# Patient Record
Sex: Female | Born: 1970
Health system: Southern US, Community
[De-identification: ages and names within clinical notes are randomized; demographics above are authoritative.]

## PROBLEM LIST (undated history)

## (undated) DIAGNOSIS — Z8042 Family history of malignant neoplasm of prostate: Secondary | ICD-10-CM

## (undated) DIAGNOSIS — C50919 Malignant neoplasm of unspecified site of unspecified female breast: Secondary | ICD-10-CM

## (undated) DIAGNOSIS — T7840XA Allergy, unspecified, initial encounter: Secondary | ICD-10-CM

## (undated) DIAGNOSIS — Z975 Presence of (intrauterine) contraceptive device: Secondary | ICD-10-CM

## (undated) DIAGNOSIS — Z800 Family history of malignant neoplasm of digestive organs: Secondary | ICD-10-CM

## (undated) DIAGNOSIS — G43909 Migraine, unspecified, not intractable, without status migrainosus: Secondary | ICD-10-CM

## (undated) HISTORY — PX: COLONOSCOPY: SHX174

## (undated) HISTORY — DX: Family history of malignant neoplasm of prostate: Z80.42

## (undated) HISTORY — PX: WISDOM TOOTH EXTRACTION: SHX21

## (undated) HISTORY — PX: COSMETIC SURGERY: SHX468

## (undated) HISTORY — DX: Migraine, unspecified, not intractable, without status migrainosus: G43.909

## (undated) HISTORY — DX: Allergy, unspecified, initial encounter: T78.40XA

## (undated) HISTORY — DX: Family history of malignant neoplasm of digestive organs: Z80.0

## (undated) HISTORY — DX: Presence of (intrauterine) contraceptive device: Z97.5

## (undated) HISTORY — PX: DENTAL SURGERY: SHX609

## (undated) HISTORY — DX: Malignant neoplasm of unspecified site of unspecified female breast: C50.919

---

## 2002-08-03 HISTORY — PX: BREAST BIOPSY: SHX20

## 2016-06-03 ENCOUNTER — Other Ambulatory Visit: Payer: Self-pay | Admitting: Family Medicine

## 2016-06-03 DIAGNOSIS — Z1231 Encounter for screening mammogram for malignant neoplasm of breast: Secondary | ICD-10-CM

## 2016-06-04 DIAGNOSIS — Z0000 Encounter for general adult medical examination without abnormal findings: Secondary | ICD-10-CM | POA: Diagnosis not present

## 2016-06-04 LAB — LIPID PANEL
Cholesterol: 131 (ref 0–200)
HDL: 50 (ref 35–70)
LDL CALC: 75
TRIGLYCERIDES: 32 — AB (ref 40–160)

## 2016-06-04 LAB — BASIC METABOLIC PANEL: Glucose: 87

## 2016-07-10 ENCOUNTER — Ambulatory Visit
Admission: RE | Admit: 2016-07-10 | Discharge: 2016-07-10 | Disposition: A | Discharge status: Home / Self Care | Payer: BLUE CROSS/BLUE SHIELD | Source: Ambulatory Visit | Attending: Family Medicine | Admitting: Family Medicine

## 2016-07-10 DIAGNOSIS — Z1231 Encounter for screening mammogram for malignant neoplasm of breast: Secondary | ICD-10-CM

## 2016-08-12 DIAGNOSIS — D229 Melanocytic nevi, unspecified: Secondary | ICD-10-CM | POA: Diagnosis not present

## 2016-08-12 DIAGNOSIS — L814 Other melanin hyperpigmentation: Secondary | ICD-10-CM | POA: Diagnosis not present

## 2018-04-14 DIAGNOSIS — H3582 Retinal ischemia: Secondary | ICD-10-CM | POA: Diagnosis not present

## 2018-05-17 ENCOUNTER — Ambulatory Visit: Payer: BLUE CROSS/BLUE SHIELD | Admitting: Family Medicine

## 2018-05-17 ENCOUNTER — Encounter: Payer: Self-pay | Admitting: Family Medicine

## 2018-05-17 ENCOUNTER — Other Ambulatory Visit: Payer: Self-pay

## 2018-05-17 VITALS — BP 98/62 | HR 68 | Temp 97.9°F | Ht 66.0 in | Wt 148.6 lb

## 2018-05-17 DIAGNOSIS — Z0000 Encounter for general adult medical examination without abnormal findings: Secondary | ICD-10-CM | POA: Diagnosis not present

## 2018-05-17 DIAGNOSIS — Z975 Presence of (intrauterine) contraceptive device: Secondary | ICD-10-CM | POA: Diagnosis not present

## 2018-05-17 DIAGNOSIS — M2560 Stiffness of unspecified joint, not elsewhere classified: Secondary | ICD-10-CM | POA: Diagnosis not present

## 2018-05-17 DIAGNOSIS — R232 Flushing: Secondary | ICD-10-CM

## 2018-05-17 DIAGNOSIS — H3581 Retinal edema: Secondary | ICD-10-CM

## 2018-05-17 DIAGNOSIS — F419 Anxiety disorder, unspecified: Secondary | ICD-10-CM

## 2018-05-17 LAB — CBC WITH DIFFERENTIAL/PLATELET
BASOS PCT: 0.4 % (ref 0.0–3.0)
Basophils Absolute: 0 10*3/uL (ref 0.0–0.1)
EOS ABS: 0 10*3/uL (ref 0.0–0.7)
Eosinophils Relative: 0.5 % (ref 0.0–5.0)
HEMATOCRIT: 38.4 % (ref 36.0–46.0)
Hemoglobin: 12.9 g/dL (ref 12.0–15.0)
LYMPHS PCT: 20.5 % (ref 12.0–46.0)
Lymphs Abs: 1.5 10*3/uL (ref 0.7–4.0)
MCHC: 33.6 g/dL (ref 30.0–36.0)
MCV: 94.2 fl (ref 78.0–100.0)
Monocytes Absolute: 0.4 10*3/uL (ref 0.1–1.0)
Monocytes Relative: 6 % (ref 3.0–12.0)
NEUTROS ABS: 5.2 10*3/uL (ref 1.4–7.7)
Neutrophils Relative %: 72.6 % (ref 43.0–77.0)
PLATELETS: 254 10*3/uL (ref 150.0–400.0)
RBC: 4.07 Mil/uL (ref 3.87–5.11)
RDW: 13 % (ref 11.5–15.5)
WBC: 7.1 10*3/uL (ref 4.0–10.5)

## 2018-05-17 LAB — LIPID PANEL
CHOL/HDL RATIO: 3
Cholesterol: 139 mg/dL (ref 0–200)
HDL: 52.3 mg/dL (ref >39.00)
LDL CALC: 77 mg/dL (ref 0–99)
NonHDL: 86.67
TRIGLYCERIDES: 50 mg/dL (ref 0.0–149.0)
VLDL: 10 mg/dL (ref 0.0–40.0)

## 2018-05-17 LAB — COMPREHENSIVE METABOLIC PANEL
ALT: 11 U/L (ref 0–35)
AST: 12 U/L (ref 0–37)
Albumin: 4.4 g/dL (ref 3.5–5.2)
Alkaline Phosphatase: 58 U/L (ref 39–117)
BUN: 14 mg/dL (ref 6–23)
CHLORIDE: 103 meq/L (ref 96–112)
CO2: 30 meq/L (ref 19–32)
Calcium: 9.5 mg/dL (ref 8.4–10.5)
Creatinine, Ser: 0.62 mg/dL (ref 0.40–1.20)
GFR: 108.82 mL/min (ref >60.00)
Glucose, Bld: 127 mg/dL — ABNORMAL HIGH (ref 70–99)
Potassium: 3.6 mEq/L (ref 3.5–5.1)
Sodium: 140 mEq/L (ref 135–145)
Total Bilirubin: 1 mg/dL (ref 0.2–1.2)
Total Protein: 7.1 g/dL (ref 6.0–8.3)

## 2018-05-17 LAB — SEDIMENTATION RATE: Sed Rate: 13 mm/hr (ref 0–20)

## 2018-05-17 LAB — FOLLICLE STIMULATING HORMONE: FSH: 15.8 m[IU]/mL

## 2018-05-17 LAB — TSH: TSH: 0.87 u[IU]/mL (ref 0.35–4.50)

## 2018-05-17 NOTE — Progress Notes (Signed)
Subjective  CC:  Chief Complaint  Patient presents with  . Establish Care    TC from [place] at [facility], moved here from [place] in 2016  . Eye Problem    Patient states she saw a eye doctor and she has an image on her eye, she feels is medical     HPI: Kelli [name] is a 47 y.o. female who presents to [place] at [facility] today to establish care with me as a new patient.  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  She recently saw her eye doctor and he identified a cotton-wool spot.  She has no history of hypertension, diabetes, hyperlipidemia or autoimmune condition.  She feels well. She has the following concerns or needs:  As above, here to ensure no organic etiology of her coexisting medical condition to explain the cotton-wool spot.  She denies headaches, pain with chewing, visual disturbance, palpitations; her lifestyle is healthy.  She exercises routinely.  No recent headaches.  She eats well.  Review of systems is positive for morning stiffness in her hands and feet with some joint pain.  But she denies red hot swollen joints.  She does have a family history of rheumatoid arthritis.  Health maintenance is due for Pap smear and physical.  She does have an IUD in place that was placed in 2015.  She reports it is a hormonal IUD.  Review of systems is positive for mild anxiety and irritability symptoms occasional hot flushes.  She denies history of anxiety or mood disorder.  Assessment  1. Annual physical exam   2. Cotton wool spots   3. IUD (intrauterine device) in place   4. Anxiety   5. Morning stiffness of joints      Plan  Female Wellness Visit:  Age appropriate Health Maintenance and Prevention measures were discussed with patient. Included topics are cancer screening recommendations, ways to keep healthy (see AVS) including dietary and exercise recommendations, regular eye and dental  care, use of seat belts, and avoidance of moderate alcohol use and tobacco use.  She will return for Pap smear and female wellness exam  BMI: discussed patient's BMI and encouraged positive lifestyle modifications to help get to or maintain a target BMI.  HM needs and immunizations were addressed and ordered. See below for orders. See HM and immunization section for updates.  Routine labs and screening tests ordered including cmp, cbc and lipids where appropriate.  Discussed recommendations regarding Vit D and calcium supplementation (see AVS)  Start work-up for cotton-wool spot.  Check lab work.  Monitor joint pain symptoms.  Check rheumatoid factor.  Education on anxiety symptoms, could be related to perimenopausal status.  Check FSH.  Will return if she feels that she needs more help with her hot flashes and anxiety symptoms.    Follow up:  Pap smear  Orders Placed This Encounter  Procedures  . CBC with Differential/Platelet  . Comprehensive metabolic panel  . Lipid panel  . HIV Antibody (routine testing w rflx)  . TSH  . Sedimentation rate  . Rheumatoid Factor   No orders of the defined types were placed in this encounter.    No flowsheet data found.  We updated and reviewed the patient's past history in detail and it is documented below.  Patient Active Problem List   Diagnosis Date Noted  . Cotton wool spots 05/17/2018  . IUD (intrauterine device) in place 05/17/2018  June 2015    Health Maintenance  Topic Date Due  . HIV Screening  07/23/1984  . TETANUS/TDAP  07/23/1988  . PAP SMEAR  01/01/2018  . INFLUENZA VACCINE  03/03/2018    There is no immunization history on file for this patient. Current Meds  Medication Sig  . Multiple Vitamins-Minerals (EYE VITAMINS PO) Take by mouth.    Allergies: Patient is allergic to sulfa antibiotics. Past Medical History Patient  has a past medical history of IUD (intrauterine device) in place (05/17/2018) and  Migraines. Past Surgical History Patient  has a past surgical history that includes Breast biopsy (2004). Family History: Patient family history is not on file. Social History:  Patient  reports that she has never smoked. She has never used smokeless tobacco. She reports that she drinks alcohol. She reports that she does not use drugs.  Review of Systems: Constitutional: negative for fever or malaise Ophthalmic: negative for photophobia, double vision or loss of vision Cardiovascular: negative for chest pain, dyspnea on exertion, or new LE swelling Respiratory: negative for SOB or persistent cough Gastrointestinal: negative for abdominal pain, change in bowel habits or melena Genitourinary: negative for dysuria or gross hematuria Musculoskeletal: negative for new gait disturbance or muscular weakness Integumentary: negative for new or persistent rashes Neurological: negative for TIA or stroke symptoms Psychiatric: negative for SI or delusions Allergic/Immunologic: negative for hives  Patient Care Team    Relationship Specialty Notifications Start End  Leamon Arnt, MD PCP - General Family Medicine  05/17/18     Objective  Vitals: BP 98/62   Pulse 68   Temp 97.9 F (36.6 C)   Ht 5\' 6"  (1.676 m)   Wt 148 lb 9.6 oz (67.4 kg)   SpO2 99%   BMI 23.98 kg/m  General:  Well developed, well nourished, no acute distress  Psych:  Alert and oriented,normal mood and affect HEENT:  Normocephalic, atraumatic, non-icteric sclera, PERRL, oropharynx is without mass or exudate, supple neck without adenopathy, mass or thyromegaly Cardiovascular:  RRR without gallop, rub or murmur, nondisplaced PMI Respiratory:  Good breath sounds bilaterally, CTAB with normal respiratory effort Gastrointestinal: normal bowel sounds, soft, non-tender, no noted masses. No HSM MSK: no deformities, contusions. Joints are without erythema or swelling Skin:  Warm, no rashes or suspicious lesions noted Neurologic:     Mental status is normal. Gross motor and sensory exams are normal. Normal gait   Commons side effects, risks, benefits, and alternatives for medications and treatment plan prescribed today were discussed, and the patient expressed understanding of the given instructions. Patient is instructed to call or message via MyChart if he/she has any questions or concerns regarding our treatment plan. No barriers to understanding were identified. We discussed Red Flag symptoms and signs in detail. Patient expressed understanding regarding what to do in case of urgent or emergency type symptoms.   Medication list was reconciled, printed and provided to the patient in AVS. Patient instructions and summary information was reviewed with the patient as documented in the AVS. This note was prepared with assistance of Dragon voice recognition software. Occasional wrong-word or sound-a-like substitutions may have occurred due to the inherent limitations of voice recognition software

## 2018-05-17 NOTE — Patient Instructions (Addendum)
Please return for your pap smear at your convenience.  I will release your lab results to you on your MyChart account with further instructions. Please reply with any questions.   It was a pleasure meeting you today! Thank you for choosing [place] to meet your healthcare needs! I truly look forward to working with you. If you have any questions or concerns, please send me a message via Mychart or call the office at [phone].  Please check to see when your last Tdap was and let me know  Perimenopause Perimenopause is the time when your body begins to move into the menopause (no menstrual period for 12 straight months). It is a natural process. Perimenopause can begin 2-8 years before the menopause and usually lasts for 1 year after the menopause. During this time, your ovaries may or may not produce an egg. The ovaries vary in their production of estrogen and progesterone hormones each month. This can cause irregular menstrual periods, difficulty getting pregnant, vaginal bleeding between periods, and uncomfortable symptoms. What are the causes?  Irregular production of the ovarian hormones, estrogen and progesterone, and not ovulating every month. Other causes include:  Tumor of the pituitary gland in the brain.  Medical disease that affects the ovaries.  Radiation treatment.  Chemotherapy.  Unknown causes.  Heavy smoking and excessive alcohol intake can bring on perimenopause sooner.  What are the signs or symptoms?  Hot flashes.  Night sweats.  Irregular menstrual periods.  Decreased sex drive.  Vaginal dryness.  Headaches.  Mood swings.  Depression.  Memory problems.  Irritability.  Tiredness.  Weight gain.  Trouble getting pregnant.  The beginning of losing bone cells (osteoporosis).  The beginning of hardening of the arteries (atherosclerosis). How is this diagnosed? Your health care provider will make a diagnosis by analyzing your age, menstrual  history, and symptoms. He or she will do a physical exam and note any changes in your body, especially your female organs. Female hormone tests may or may not be helpful depending on the amount of female hormones you produce and when you produce them. However, other hormone tests may be helpful to rule out other problems. How is this treated? In some cases, no treatment is needed. The decision on whether treatment is necessary during the perimenopause should be made by you and your health care provider based on how the symptoms are affecting you and your lifestyle. Various treatments are available, such as:  Treating individual symptoms with a specific medicine for that symptom.  Herbal medicines that can help specific symptoms.  Counseling.  Group therapy.  Follow these instructions at home:  Keep track of your menstrual periods (when they occur, how heavy they are, how long between periods, and how long they last) as well as your symptoms and when they started.  Only take over-the-counter or prescription medicines as directed by your health care provider.  Sleep and rest.  Exercise.  Eat a diet that contains calcium (good for your bones) and soy (acts like the estrogen hormone).  Do not smoke.  Avoid alcoholic beverages.  Take vitamin supplements as recommended by your health care provider. Taking vitamin E may help in certain cases.  Take calcium and vitamin D supplements to help prevent bone loss.  Group therapy is sometimes helpful.  Acupuncture may help in some cases. Contact a health care provider if:  You have questions about any symptoms you are having.  You need a referral to a specialist (gynecologist, psychiatrist, or psychologist). Get  help right away if:  You have vaginal bleeding.  Your period lasts longer than 8 days.  Your periods are recurring sooner than 21 days.  You have bleeding after intercourse.  You have severe depression.  You have pain  when you urinate.  You have severe headaches.  You have vision problems. This information is not intended to replace advice given to you by your health care provider. Make sure you discuss any questions you have with your health care provider. Document Released: 08/27/2004 Document Revised: 12/26/2015 Document Reviewed: 02/16/2013 Elsevier Interactive Patient Education  2017 [employer].

## 2018-05-18 ENCOUNTER — Other Ambulatory Visit (INDEPENDENT_AMBULATORY_CARE_PROVIDER_SITE_OTHER): Payer: BLUE CROSS/BLUE SHIELD

## 2018-05-18 DIAGNOSIS — R7309 Other abnormal glucose: Secondary | ICD-10-CM | POA: Diagnosis not present

## 2018-05-18 LAB — HIV ANTIBODY (ROUTINE TESTING W REFLEX): HIV 1&2 Ab, 4th Generation: NONREACTIVE

## 2018-05-18 LAB — RHEUMATOID FACTOR: Rhuematoid fact SerPl-aCnc: <14 IU/mL (ref <14)

## 2018-05-18 LAB — HEMOGLOBIN A1C: Hgb A1c MFr Bld: 5.4 % (ref 4.6–6.5)

## 2018-05-18 NOTE — Progress Notes (Signed)
Please add on a1c, dx: hyperglycemia and cotton wool spot Thanks, Dr. Jonni Sanger '

## 2018-05-19 NOTE — Progress Notes (Signed)
Please call patient: I have reviewed his/her lab results. All tests are normal. No findings worrisome for chronic disease to explain eye findings; rheumatoid factor is also negative.

## 2018-05-23 ENCOUNTER — Ambulatory Visit: Payer: BLUE CROSS/BLUE SHIELD | Admitting: Family Medicine

## 2018-05-25 ENCOUNTER — Other Ambulatory Visit: Payer: Self-pay

## 2018-05-25 ENCOUNTER — Ambulatory Visit (INDEPENDENT_AMBULATORY_CARE_PROVIDER_SITE_OTHER): Payer: BLUE CROSS/BLUE SHIELD | Admitting: Family Medicine

## 2018-05-25 ENCOUNTER — Encounter: Payer: Self-pay | Admitting: Emergency Medicine

## 2018-05-25 ENCOUNTER — Other Ambulatory Visit (HOSPITAL_COMMUNITY)
Admission: RE | Admit: 2018-05-25 | Discharge: 2018-05-25 | Disposition: A | Discharge status: Home / Self Care | Payer: BLUE CROSS/BLUE SHIELD | Source: Ambulatory Visit | Attending: Family Medicine | Admitting: Family Medicine

## 2018-05-25 ENCOUNTER — Encounter: Payer: Self-pay | Admitting: Family Medicine

## 2018-05-25 VITALS — BP 122/74 | HR 68 | Temp 98.2°F | Ht 66.0 in | Wt 146.8 lb

## 2018-05-25 DIAGNOSIS — Z1239 Encounter for other screening for malignant neoplasm of breast: Secondary | ICD-10-CM

## 2018-05-25 DIAGNOSIS — Z124 Encounter for screening for malignant neoplasm of cervix: Secondary | ICD-10-CM

## 2018-05-25 DIAGNOSIS — Z01419 Encounter for gynecological examination (general) (routine) without abnormal findings: Secondary | ICD-10-CM | POA: Diagnosis not present

## 2018-05-25 NOTE — Patient Instructions (Signed)
Please return in June for IUD removal and replacement.  Please call ahead or send me a message so that I may order the IUD prior to your appointment.   If you have any questions or concerns, please don't hesitate to send me a message via MyChart or call the office at [phone]. Thank you for visiting with [place] today! It's our pleasure caring for you.  I will let you know about your pap smear results in about a week when they return.   We will call you with an appointment for your mammogram.

## 2018-05-25 NOTE — Progress Notes (Signed)
Subjective  Chief Complaint  Patient presents with  . Gynecologic Exam    female wellness with pap, doing well, wants to do TDAP today     HPI: Kelli [name] is a 47 y.o. female who presents to [place] at [facility] today for a Female Wellness Visit.   Wellness Visit: with pap, gyn exam  Doing fine. Recent cpe with normal labs reviewed. Has f/u with eye doctor in December.   No h/o abnl paps. IUD in place due out in June. Would like to replace to get through menopause. No sxs of infection or risks of stds.   Due mammogram. Last was 2 years ago.   Assessment  1. Well female exam with routine gynecological exam   2. Cervical cancer screening   3. Breast cancer screening      Plan  Female Wellness Visit:  Age appropriate Health Maintenance and Prevention measures were discussed with patient. Included topics are cancer screening recommendations, ways to keep healthy (see AVS) including dietary and exercise recommendations, regular eye and dental care, use of seat belts, and avoidance of moderate alcohol use and tobacco use.   BMI: discussed patient's BMI and encouraged positive lifestyle modifications to help get to or maintain a target BMI. mammo ordered.   HM needs and immunizations were addressed and ordered. See below for orders. See HM and immunization section for updates. tdap today  Discussed recommendations regarding Vit D and calcium supplementation (see AVS)  IUD: will replace in June.   Follow up: Return in about 8 months (around 01/24/2019) for IUD removal and replacement.   Orders Placed This Encounter  Procedures  . MM DIGITAL SCREENING BILATERAL   No orders of the defined types were placed in this encounter.     Lifestyle: Body mass index is 23.69 kg/m. Wt Readings from Last 3 Encounters:  05/25/18 146 lb 12.8 oz (66.6 kg)  05/17/18 148 lb 9.6 oz (67.4 kg)     Patient Active Problem List   Diagnosis Date Noted  . Cotton  wool spots 05/17/2018  . IUD (intrauterine device) in place 05/17/2018    June 2015    Health Maintenance  Topic Date Due  . Samul Dada  07/23/1988  . PAP SMEAR  01/01/2018  . INFLUENZA VACCINE  Completed  . HIV Screening  Completed   Immunization History  Administered Date(s) Administered  . Influenza-Unspecified 05/24/2018   We updated and reviewed the patient's past history in detail and it is documented below. Allergies: Patient is allergic to sulfa antibiotics. Past Medical History Patient  has a past medical history of IUD (intrauterine device) in place (05/17/2018) and Migraines. Past Surgical History Patient  has a past surgical history that includes Breast biopsy (2004). Family History: Patient family history includes Arthritis in her maternal grandmother and mother; Cancer in her mother; Diabetes in her paternal grandmother; Hearing loss in her father; Hypertension in her maternal grandfather and mother; Learning disabilities in her son. Social History:  Patient  reports that she has never smoked. She has never used smokeless tobacco. She reports that she drinks alcohol. She reports that she does not use drugs.  Review of Systems: Constitutional: negative for fever or malaise Ophthalmic: negative for photophobia, double vision or loss of vision Cardiovascular: negative for chest pain, dyspnea on exertion, or new LE swelling Respiratory: negative for SOB or persistent cough Gastrointestinal: negative for abdominal pain, change in bowel habits or melena Genitourinary: negative for dysuria or gross hematuria, no abnormal uterine  bleeding or disharge Musculoskeletal: negative for new gait disturbance or muscular weakness Integumentary: negative for new or persistent rashes, no breast lumps Neurological: negative for TIA or stroke symptoms Psychiatric: negative for SI or delusions Allergic/Immunologic: negative for hives Patient Care Team    Relationship Specialty  Notifications Start End  Leamon Arnt, MD PCP - General Family Medicine  05/17/18     Objective  Vitals: BP 122/74   Pulse 68   Temp 98.2 F (36.8 C)   Ht 5\' 6"  (1.676 m)   Wt 146 lb 12.8 oz (66.6 kg)   SpO2 99%   BMI 23.69 kg/m  General:  Well developed, well nourished, no acute distress  Psych:  Alert and orientedx3,normal mood and affect Cardiovascular:  Normal S1, S2, RRR without gallop, rub or murmur, nondisplaced PMI Respiratory:  Good breath sounds bilaterally, CTAB with normal respiratory effort Gastrointestinal: normal bowel sounds, soft, non-tender, no noted masses. No HSM Skin:  Warm, no rashes or suspicious lesions noted Breast Exam: No mass, skin retraction or nipple discharge is appreciated in either breast. No axillary adenopathy. Fibrocystic changes are not noted Pelvic Exam: Normal external genitalia, no vulvar or vaginal lesions present. Clear cervix w/o CMT, IUD string visualized in os. Bimanual exam reveals a nontender fundus w/o masses, nl size. No adnexal masses present. No inguinal adenopathy. A PAP smear was performed.    Commons side effects, risks, benefits, and alternatives for medications and treatment plan prescribed today were discussed, and the patient expressed understanding of the given instructions. Patient is instructed to call or message via MyChart if he/she has any questions or concerns regarding our treatment plan. No barriers to understanding were identified. We discussed Red Flag symptoms and signs in detail. Patient expressed understanding regarding what to do in case of urgent or emergency type symptoms.   Medication list was reconciled, printed and provided to the patient in AVS. Patient instructions and summary information was reviewed with the patient as documented in the AVS. This note was prepared with assistance of Dragon voice recognition software. Occasional wrong-word or sound-a-like substitutions may have occurred due to the inherent  limitations of voice recognition software

## 2018-05-27 ENCOUNTER — Other Ambulatory Visit: Payer: Self-pay | Admitting: Family Medicine

## 2018-05-27 DIAGNOSIS — Z1231 Encounter for screening mammogram for malignant neoplasm of breast: Secondary | ICD-10-CM

## 2018-05-30 LAB — CYTOLOGY - PAP
Diagnosis: NEGATIVE
HPV: NOT DETECTED

## 2018-07-13 ENCOUNTER — Ambulatory Visit
Admission: RE | Admit: 2018-07-13 | Discharge: 2018-07-13 | Disposition: A | Discharge status: Home / Self Care | Payer: BLUE CROSS/BLUE SHIELD | Source: Ambulatory Visit | Attending: Family Medicine | Admitting: Family Medicine

## 2018-07-13 DIAGNOSIS — Z1231 Encounter for screening mammogram for malignant neoplasm of breast: Secondary | ICD-10-CM | POA: Diagnosis not present

## 2018-07-14 ENCOUNTER — Other Ambulatory Visit: Payer: Self-pay | Admitting: Family Medicine

## 2018-07-14 DIAGNOSIS — H3582 Retinal ischemia: Secondary | ICD-10-CM | POA: Diagnosis not present

## 2018-07-14 DIAGNOSIS — R928 Other abnormal and inconclusive findings on diagnostic imaging of breast: Secondary | ICD-10-CM

## 2018-07-19 ENCOUNTER — Ambulatory Visit: Admission: RE | Admit: 2018-07-19 | Payer: BLUE CROSS/BLUE SHIELD | Source: Ambulatory Visit

## 2018-07-19 ENCOUNTER — Ambulatory Visit
Admission: RE | Admit: 2018-07-19 | Discharge: 2018-07-19 | Disposition: A | Discharge status: Home / Self Care | Payer: BLUE CROSS/BLUE SHIELD | Source: Ambulatory Visit | Attending: Family Medicine | Admitting: Family Medicine

## 2018-07-19 ENCOUNTER — Other Ambulatory Visit: Payer: Self-pay | Admitting: Family Medicine

## 2018-07-19 DIAGNOSIS — R928 Other abnormal and inconclusive findings on diagnostic imaging of breast: Secondary | ICD-10-CM

## 2018-07-19 DIAGNOSIS — N6311 Unspecified lump in the right breast, upper outer quadrant: Secondary | ICD-10-CM | POA: Diagnosis not present

## 2018-07-19 DIAGNOSIS — R922 Inconclusive mammogram: Secondary | ICD-10-CM | POA: Diagnosis not present

## 2018-07-22 ENCOUNTER — Other Ambulatory Visit: Payer: Self-pay | Admitting: Family Medicine

## 2018-07-22 ENCOUNTER — Ambulatory Visit
Admission: RE | Admit: 2018-07-22 | Discharge: 2018-07-22 | Disposition: A | Discharge status: Home / Self Care | Payer: BLUE CROSS/BLUE SHIELD | Source: Ambulatory Visit | Attending: Family Medicine | Admitting: Family Medicine

## 2018-07-22 DIAGNOSIS — R928 Other abnormal and inconclusive findings on diagnostic imaging of breast: Secondary | ICD-10-CM

## 2018-07-22 DIAGNOSIS — C50411 Malignant neoplasm of upper-outer quadrant of right female breast: Secondary | ICD-10-CM | POA: Diagnosis not present

## 2018-07-22 DIAGNOSIS — N6311 Unspecified lump in the right breast, upper outer quadrant: Secondary | ICD-10-CM | POA: Diagnosis not present

## 2018-07-22 DIAGNOSIS — Z0389 Encounter for observation for other suspected diseases and conditions ruled out: Secondary | ICD-10-CM | POA: Diagnosis not present

## 2018-07-28 ENCOUNTER — Other Ambulatory Visit: Payer: BLUE CROSS/BLUE SHIELD

## 2018-08-08 ENCOUNTER — Ambulatory Visit: Payer: Self-pay | Admitting: Surgery

## 2018-08-08 DIAGNOSIS — C50911 Malignant neoplasm of unspecified site of right female breast: Secondary | ICD-10-CM | POA: Diagnosis not present

## 2018-08-08 NOTE — H&P (Signed)
Kelli [name] Documented: 08/08/2018 9:32 AM Location: [facility] Patient #: [id number] DOB: [birth year] Married / Language: English / Race: White Female  History of Present Illness Marcello Moores A. Boston Catarino MD; 08/08/2018 10:22 AM) Patient words: Patient sent at the request of Dr. Jule Ser for abnormal screening mammogram. The patient went for her yearly mammogram last month. She has very dense breasts but there is an abnormality in the right breast upper quadrant measuring roughly 1 cm was evaluated by ultrasound. This was felt to be suspicious. Core biopsy revealed an invasive mixed phenotype breast carcinoma ER positive PR positive HER-2/neu negative KI 10%. The patient denies any history of breast pain, breast masses or nipple discharge. She states her breasts are very dense and hard to examine. She has no family history of breast cancer, uterine cancer or ovarian cancer. She is sore from her biopsy was otherwise has no complaints.                            ADDENDUM REPORT: 07/19/2018 14:49 ADDENDUM: An addendum is made due to an omission in the exam portion of the report. The correct report should read as: CLINICAL DATA: Screening recall for possible right breast distortion and left breast asymmetry. EXAM: DIGITAL DIAGNOSTIC BILATERAL MAMMOGRAM WITH CAD AND TOMO RIGHT BREAST ULTRASOUND COMPARISON: Previous exam(s). ACR Breast Density: ACR Breast Density Category d: The breast tissue is extremely dense, which lowers the sensitivity of mammography. FINDINGS: Additional tomograms were performed of the bilateral breasts. There is a persistent area of subtle distortion in the upper-outer posterior right breast. The additional initially questioned area of possible right breast distortion appears to resolve on the additional imaging. The initially questioned asymmetry in the left breast resolves on the additional imaging with findings  compatible with overlapping structures. Mammographic images were processed with CAD. Physical examination of the upper-outer right breast reveals an area of thickening at the approximate 11 o'clock position. Targeted ultrasound of the upper-outer right breast was performed. There is an irregular hypoechoic mass at the 11 o'clock position 4 cm from nipple measuring 1.4 x 1.1 x 0.8 cm. This is felt to correspond with the distortion seen in the upper-outer right breast on mammography. Several benign cysts are seen in the right breast, with a cyst at the 12 o'clock position 4 cm from nipple measuring 0.8 x 0.7 x 0.7 cm. No lymphadenopathy seen in the right axilla. IMPRESSION: Suspicious right breast mass/distortion at the 11 o'clock position. RECOMMENDATION: 1. Recommend ultrasound-guided biopsy of the mass in the right breast at the 11 o'clock position. 2. Depending on biopsy results, recommend bilateral breast MRI given extremely dense fibroglandular tissue and the additional possible area of distortion seen in the right breast that was not definitely reproduced on today's additional imaging. I have discussed the findings and recommendations with the patient. Results were also provided in writing at the conclusion of the visit. If applicable, a reminder letter will be sent to the patient regarding the next appointment. BI-RADS CATEGORY: 4: Suspicious. Electronically Signed By: Everlean Alstrom M.D. On: 07/19/2018 14:49 Addended by Everlean Alstrom, MD on 07/19/2018 2:51 PM  Study Result CLINICAL DATA: Screening recall for possible right breast distortion and left breast asymmetry. EXAM: DIGITAL DIAGNOSTIC BILATERAL MAMMOGRAM WITH CAD AND TOMO COMPARISON: Previous exam(s). ACR Breast Density Category d: The breast tissue is extremely dense, which lowers the sensitivity of mammography. FINDINGS: Additional tomograms were performed of the bilateral breasts. There is a  persistent area of subtle distortion in the upper-outer posterior right breast. The additional initially questioned area of possible right breast distortion appears to resolve on the additional imaging. The initially questioned asymmetry in the left breast resolves on the additional imaging with findings compatible with overlapping structures. Mammographic images were processed with CAD. Physical examination of the upper-outer right breast reveals an area of thickening at the approximate 11 o'clock position. Targeted ultrasound of the upper-outer right breast was performed. There is an irregular hypoechoic mass at the 11 o'clock position 4 cm from nipple measuring 1.4 x 1.1 x 0.8 cm. This is felt to correspond with the distortion seen in the upper-outer right breast on mammography. Several benign cysts are seen in the right breast, with a cyst at the 12 o'clock position 4 cm from nipple measuring 0.8 x 0.7 x 0.7 cm. No lymphadenopathy seen in the right axilla. IMPRESSION: Suspicious right breast mass/distortion at the 11 o'clock position. RECOMMENDATION: 1. Recommend ultrasound-guided biopsy of the mass in the right breast at the 11 o'clock position. 2. Depending on biopsy results, recommend bilateral breast MRI given extremely dense fibroglandular tissue and the additional possible area of distortion seen in the right breast that was not definitely reproduced on today's additional imaging. I have discussed the findings and recommendations with the patient. Results were also provided in writing at the conclusion of the visit. If applicable, a reminder letter will be sent to the patient regarding the next appointment. BI-RADS CATEGORY 4: Suspicious. Electronically Signed: By: Everlean Alstrom M.D. On: 07/19/2018 12:22                 ADDITIONAL INFORMATION: E-cadherin is moderately positive in some areas and only weakly positive in others, suggestive of a  mixed phenotype. Vicente Males MD Pathologist, Electronic Signature ( Signed 07/26/2018) PROGNOSTIC INDICATORS Results: IMMUNOHISTOCHEMICAL AND MORPHOMETRIC ANALYSIS PERFORMED MANUALLY The tumor cells are NEGATIVE for Her2 (1+). Estrogen Receptor: 95%, POSITIVE, STRONG STAINING INTENSITY Progesterone Receptor: 95%, POSITIVE, STRONG STAINING INTENSITY Proliferation Marker Ki67: 10% REFERENCE RANGE ESTROGEN RECEPTOR NEGATIVE 0% POSITIVE =>1% REFERENCE RANGE PROGESTERONE RECEPTOR NEGATIVE 0% POSITIVE =>1% All controls stained appropriately Enid Cutter MD Pathologist, Electronic Signature ( Signed 07/28/2018) 1 of 3 FINAL for Yung, Mayla ANN ([id number]) FINAL DIAGNOSIS Diagnosis Breast, right, needle core biopsy, 11 o'clock, 4cmfn - INVASIVE MAMMARY CARCINOMA, SEE COMMENT. - MAMMARY CARCINOMA IN SITU. Microscopic Comment The carcinoma appears grade 2. E-cadherin will be ordered. Prognostic markers will be ordered. Dr. Lyndon Code has reviewed the case. The case was called to [facility] on 07/25/2018. Vicente Males MD Pathologist, Electronic Signature (Case signed 07/25/2018) Specimen Gross and Clinical Information Specimen Comment Time in formalin: 9:56 AM, extracted 1 minute; 1.4cm mass Specimen(s) Obtained: Breast, right, needle core biopsy, 11 o'clock, 4cmfn Specimen Clinical Information Should be [facility] Gross Received labeled as "Linder,Bryant" and "right breast 11:00 4cmfn.  The patient is a 48 year old female.   Past Surgical History Nance Pew, CMA; 08/08/2018 9:32 AM) Breast Biopsy Right.  Diagnostic Studies History Nance Pew, CMA; 08/08/2018 9:32 AM) Colonoscopy never Mammogram within last year Pap Smear 1-5 years ago  Allergies Nance Pew, CMA; 08/08/2018 9:33 AM) Sulfa Drugs Rash. Allergies Reconciled  Medication History Nance Pew, CMA; 08/08/2018 9:33 AM) No Current Medications Medications Reconciled  Social  History Nance Pew, CMA; 08/08/2018 9:32 AM) Alcohol use Moderate alcohol use. Caffeine use Coffee. No drug use Tobacco use Never smoker.  Family History Nance Pew, [place]; 08/08/2018 9:32 AM) Colon Cancer Mother.  Colon Polyps Mother. Depression Mother. Hypertension Mother. Migraine Headache Mother.  Pregnancy / Birth History Nance Pew, [place]; 08/08/2018 9:32 AM) Age at menarche 17 years. Contraceptive History Intrauterine device. Gravida 2 Irregular periods Length (months) of breastfeeding 7-12 Maternal age 35-35 Para 2  Other Problems Nance Pew, CMA; 08/08/2018 9:32 AM) Breast Cancer     Review of Systems (Asucena Galer A. Angelamarie Avakian MD; 08/08/2018 10:22 AM) General Not Present- Appetite Loss, Chills, Fatigue, Fever, Night Sweats, Weight Gain and Weight Loss. Skin Not Present- Change in Wart/Mole, Dryness, Hives, Jaundice, New Lesions, Non-Healing Wounds, Rash and Ulcer. HEENT Present- Wears glasses/contact lenses. Not Present- Earache, Hearing Loss, Hoarseness, Nose Bleed, Oral Ulcers, Ringing in the Ears, Seasonal Allergies, Sinus Pain, Sore Throat, Visual Disturbances and Yellow Eyes. Respiratory Not Present- Bloody sputum, Chronic Cough, Difficulty Breathing, Snoring and Wheezing. Breast Present- Breast Mass. Not Present- Breast Pain, Nipple Discharge and Skin Changes. Cardiovascular Not Present- Chest Pain, Difficulty Breathing Lying Down, Leg Cramps, Palpitations, Rapid Heart Rate, Shortness of Breath and Swelling of Extremities. Gastrointestinal Not Present- Abdominal Pain, Bloating, Bloody Stool, Change in Bowel Habits, Chronic diarrhea, Constipation, Difficulty Swallowing, Excessive gas, Gets full quickly at meals, Hemorrhoids, Indigestion, Nausea, Rectal Pain and Vomiting. Female Genitourinary Not Present- Frequency, Nocturia, Painful Urination, Pelvic Pain and Urgency. Musculoskeletal Not Present- Back Pain, Joint Pain, Joint Stiffness, Muscle Pain, Muscle  Weakness and Swelling of Extremities. Neurological Present- Headaches. Not Present- Decreased Memory, Fainting, Numbness, Seizures, Tingling, Tremor, Trouble walking and Weakness. Psychiatric Present- Anxiety. Not Present- Bipolar, Change in Sleep Pattern, Depression, Fearful and Frequent crying. Endocrine Not Present- Cold Intolerance, Excessive Hunger, Hair Changes, Heat Intolerance, Hot flashes and New Diabetes. Hematology Not Present- Blood Thinners, Easy Bruising, Excessive bleeding, Gland problems, HIV and Persistent Infections. All other systems negative  Vitals (Sabrina Canty CMA; 08/08/2018 9:34 AM) 08/08/2018 9:34 AM Weight: 143.5 lb Height: 66in Body Surface Area: 1.74 m Body Mass Index: 23.16 kg/m  Temp.: 97.34F(Temporal)  Pulse: 118 (Regular)  BP: 118/78 (Sitting, Right Arm, Standard)      Physical Exam (Ignatz Deis A. Adyan Palau MD; 08/08/2018 10:22 AM)  General Mental Status-Alert. General Appearance-Consistent with stated age. Hydration-Well hydrated. Voice-Normal.  Head and Neck Head-normocephalic, atraumatic with no lesions or palpable masses. Trachea-midline. Thyroid Gland Characteristics - normal size and consistency.  Eye Eyeball - Bilateral-Extraocular movements intact. Sclera/Conjunctiva - Bilateral-No scleral icterus.  Chest and Lung Exam Chest and lung exam reveals -quiet, even and easy respiratory effort with no use of accessory muscles and on auscultation, normal breath sounds, no adventitious sounds and normal vocal resonance. Inspection Chest Wall - Normal. Back - normal.  Breast Note: She noted right breast upper quadrant. No mass. Left breast is dense without mass lesion.  Cardiovascular Cardiovascular examination reveals -normal heart sounds, regular rate and rhythm with no murmurs and normal pedal pulses bilaterally.  Neurologic Neurologic evaluation reveals -alert and oriented x 3 with no impairment of recent or  remote memory. Mental Status-Normal.  Musculoskeletal Normal Exam - Left-Upper Extremity Strength Normal and Lower Extremity Strength Normal. Normal Exam - Right-Upper Extremity Strength Normal and Lower Extremity Strength Normal.  Lymphatic Head & Neck  General Head & Neck Lymphatics: Bilateral - Description - Normal. Axillary  General Axillary Region: Bilateral - Description - Normal. Tenderness - Non Tender.    Assessment & Plan (Brenen Beigel A. Kraven Calk MD; 08/08/2018 10:23 AM)  BREAST CANCER, RIGHT (C50.911) Impression: The patient requires MRI due to her breast density and mixed phenotype.  Further medical and radiation oncology  Referring  genetics  The patient is interested in breast conserving surgery and discussed lumpectomy with lymph node mapping. Surgery will be dependent upon MRI findings are discussed with her today. Risk of lumpectomy include bleeding, infection, seroma, more surgery, use of seed/wire, wound care, cosmetic deformity and the need for other treatments, death , blood clots, death. Pt agrees to proceed. Risk of sentinel lymph node mapping include bleeding, infection, lymphedema, shoulder pain. stiffness, dye allergy. cosmetic deformity , blood clots, death, need for more surgery. Pt agres to proceed. Discussed treatment options for breast cancer to include breast conservation vs mastectomy with reconstruction. Pt has decided on mastectomy. Risk include bleeding, infection, flap necrosis, pain, numbness, recurrence, hematoma, other surgery needs. Pt understands and agrees to proceed.  Current Plans Pt Education - [facility] Breast Cancer Information Given - [facility] "Breast Journey" Package Pt Education - Pamphlet Given - Breast Biopsy: discussed with patient and provided information. We discussed the staging and pathophysiology of breast cancer. We discussed all of the different options for treatment for breast cancer including surgery, chemotherapy, radiation therapy,  Herceptin, and antiestrogen therapy. We discussed a sentinel lymph node biopsy as she does not appear to having lymph node involvement right now. We discussed the performance of that with injection of radioactive tracer and blue dye. We discussed that she would have an incision underneath her axillary hairline. We discussed that there is a bout a 10-20% chance of having a positive node with a sentinel lymph node biopsy and we will await the permanent pathology to make any other first further decisions in terms of her treatment. One of these options might be to return to the operating room to perform an axillary lymph node dissection. We discussed about a 1-2% risk lifetime of chronic shoulder pain as well as lymphedema associated with a sentinel lymph node biopsy. We discussed the options for treatment of the breast cancer which included lumpectomy versus a mastectomy. We discussed the performance of the lumpectomy with a wire placement. We discussed a 10-20% chance of a positive margin requiring reexcision in the operating room. We also discussed that she may need radiation therapy or antiestrogen therapy or both if she undergoes lumpectomy. We discussed the mastectomy and the postoperative care for that as well. We discussed that there is no difference in her survival whether she undergoes lumpectomy with radiation therapy or antiestrogen therapy versus a mastectomy. There is a slight difference in the local recurrence rate being 3-5% with lumpectomy and about 1% with a mastectomy. We discussed the risks of operation including bleeding, infection, possible reoperation. She understands her further therapy will be based on what her stages at the time of her operation.  Pt Education - [facility] breast cancer surgery: discussed with patient and provided information. Pt Education - [facility] Breast Biopsy HCI: discussed with patient and provided information. Pt Education - ABC (After Breast Cancer) Class Info: discussed  with patient and provided information. Pt Education - [facility] Breast Pains Education

## 2018-08-09 ENCOUNTER — Other Ambulatory Visit: Payer: Self-pay | Admitting: Surgery

## 2018-08-09 DIAGNOSIS — C50911 Malignant neoplasm of unspecified site of right female breast: Secondary | ICD-10-CM

## 2018-08-10 ENCOUNTER — Encounter: Payer: Self-pay | Admitting: Licensed Clinical Social Worker

## 2018-08-10 ENCOUNTER — Telehealth: Payer: Self-pay | Admitting: Hematology and Oncology

## 2018-08-10 ENCOUNTER — Encounter: Payer: Self-pay | Admitting: Hematology and Oncology

## 2018-08-10 NOTE — Telephone Encounter (Signed)
Received referrals for genetics and medonc from [facility] for breast cancer. Pt has been scheduled to see Dr. Lindi Adie on  1/16 at 1pm and Faith Rogue for genetics on 1/16 at 11am. Letters mailed to the pt.

## 2018-08-14 ENCOUNTER — Ambulatory Visit
Admission: RE | Admit: 2018-08-14 | Discharge: 2018-08-14 | Disposition: A | Discharge status: Home / Self Care | Payer: BLUE CROSS/BLUE SHIELD | Source: Ambulatory Visit | Attending: Surgery | Admitting: Surgery

## 2018-08-14 DIAGNOSIS — C50911 Malignant neoplasm of unspecified site of right female breast: Secondary | ICD-10-CM

## 2018-08-14 DIAGNOSIS — D0512 Intraductal carcinoma in situ of left breast: Secondary | ICD-10-CM | POA: Diagnosis not present

## 2018-08-14 DIAGNOSIS — D0511 Intraductal carcinoma in situ of right breast: Secondary | ICD-10-CM | POA: Diagnosis not present

## 2018-08-14 MED ORDER — GADOBUTROL 1 MMOL/ML IV SOLN
6.0000 mL | Freq: Once | INTRAVENOUS | Status: AC | PRN
  Administered 2018-08-14: 6 mL via INTRAVENOUS

## 2018-08-15 NOTE — Progress Notes (Signed)
Location of Breast Cancer: Right Breast  Histology per Pathology Report:  07/22/18 Diagnosis Breast, right, needle core biopsy, 11 o'clock, 4cmfn - INVASIVE MAMMARY CARCINOMA, SEE COMMENT. - MAMMARY CARCINOMA IN SITU.  Receptor Status: ER(95%), PR (95%), Her2-neu (NEG), Ki-(10%)  Did patient present with symptoms or was this found on screening mammography?: It was found on a screening mammogram.   Past/Anticipated interventions by surgeon, if any: Dr. Brantley Stage consult 08/08/18   Past/Anticipated interventions by medical oncology, if any: Dr. Lindi Adie 08/18/18 He plans to start her on anit-estrogen.   Lymphedema issues, if any:  N/a  Pain issues, if any:  She denies.   SAFETY ISSUES:  Prior radiation? No  Pacemaker/ICD? No  Possible current pregnancy? IUD in place.   Is the patient on methotrexate? No  Current Complaints / other details:   08/18/18 Genetic Counseling.   08/14/18 MRI Breast completed.  IMPRESSION: 1. Suspect significant additional disease throughout the UPPER INNER and UPPER OUTER quadrants of the RIGHT breast, probably inclusive of the recently biopsied mass in the 11 o'clock location of the RIGHT breast. 2. Suspect discrete mass in the [place] of the RIGHT breast measuring up to 1.8 centimeters. Recommend biopsy of this lesion. 3. Asymmetric nodular enhancement throughout the LOWER portion of the RIGHT breast is indeterminate. 4. Indeterminate 6 millimeter mass in the UPPER-OUTER QUADRANT of the LEFT breast for which biopsy is recommended.    BP (!) 124/57 (BP Location: Left Arm, Patient Position: Sitting)   Pulse 67   Temp 98 F (36.7 C) (Oral)   Resp 18   Ht 5' 6" (1.676 m)   Wt 141 lb 4 oz (64.1 kg)   SpO2 100%   BMI 22.80 kg/m  Sarai January, Stephani Police, RN   Wt Readings from Last 3 Encounters:  08/19/18 141 lb 4 oz (64.1 kg)  08/18/18 142 lb 1.6 oz (64.5 kg)  05/25/18 146 lb 12.8 oz (66.6 kg)   08/15/2018,9:52 AM

## 2018-08-17 ENCOUNTER — Other Ambulatory Visit: Payer: Self-pay | Admitting: Surgery

## 2018-08-17 DIAGNOSIS — R9389 Abnormal findings on diagnostic imaging of other specified body structures: Secondary | ICD-10-CM

## 2018-08-17 DIAGNOSIS — N6310 Unspecified lump in the right breast, unspecified quadrant: Secondary | ICD-10-CM

## 2018-08-17 NOTE — Progress Notes (Signed)
[facility] CONSULT NOTE  Patient Care Team: Leamon Arnt, MD as PCP - General (Family Medicine)  CHIEF COMPLAINTS/PURPOSE OF CONSULTATION: Newly diagnosed breast cancer  HISTORY OF PRESENTING ILLNESS:  Kelli [name] 48 y.o. female is here because of recent diagnosis of invasive ductal carcinoma and invasive lobular carcinoma of the right breast. The cancer was detected on a routine screening mammogram on 07/13/18 and was not palpable. A diagnostic mammogram on 07/19/18 showed a suspicious mass in the right breast at the 11 o'clock position. A biopsy of this mass on 07/22/18 showed the 1.4cm cancer to be grade 2 invasive mammary carcinoma, HER2 negative, ER 95%, PR 95%, Ki67 10%. A breast MRI on 08/14/18 revealed additional disease in the upper inner and upper outer quadrants of the right breast and nodular enhancement in the right breast measuring 5.3cm.  Another 1.8cm mass was found in the upper inner quadrant and has not yet been biopsied. Nodular enhancement in the lower right breast was indeterminate and a 58m mass in the upper-outer quadrant of the left breast was recommended for biopsy. There was no lymph node involvement and the cancer was defined as stage 1a.   She presents to the clinic today with her husband. She has not yet gone through menopause. She has an IUD that was [place] abroad and she is not sure what type it is but she will consider getting it removed. She has a family history of colon and prostate cancer. She has two kids who she is at home with. She has another biopsy planned for 08/26/18.   I reviewed her records extensively and collaborated the history with the patient.  SUMMARY OF ONCOLOGIC HISTORY:   Malignant neoplasm of upper-outer quadrant of right breast in female, estrogen receptor positive ([facility]   07/22/2018 Initial Diagnosis    Screening detected right breast mass right breast biopsy 11 o'clock position: Invasive mammary cancer mixed  histology, ER 95%, PR 95%, Ki-67 10%, HER-2 -1+ by IHC,    08/14/2018 Breast MRI    Right breast upper central portion non-mass enhancement 5.3 cm, along the medial aspect discrete mass 1.8 cm, 2 adjacent satellite nodules together make 4 cm.  Scattered 6 to 7 mm enhancing nodules lower portion right breast, left breast 0.6 cm enhancing mass     08/19/2018 Cancer Staging    Staging form: Breast, AJCC 8th Edition - Clinical: Stage IA (cT1c, cN0, cM0, G2, ER+, PR+, HER2-) - Signed by SEppie Gibson MD on 08/19/2018    MEDICAL HISTORY:  Past Medical History:  Diagnosis Date  . Family history of colon cancer   . Family history of prostate cancer   . IUD (intrauterine device) in place 05/17/2018   June 2015  . Migraines     SURGICAL HISTORY: Past Surgical History:  Procedure Laterality Date  . BREAST BIOPSY  2004    SOCIAL HISTORY: Social History   Socioeconomic History  . Marital status: Married    Spouse name: Not on file  . Number of children: 2  . Years of education: Not on file  . Highest education level: Not on file  Occupational History  . Occupation: homemaker  Social Needs  . Financial resource strain: Not on file  . Food insecurity:    Worry: Not on file    Inability: Not on file  . Transportation needs:    Medical: No    Non-medical: No  Tobacco Use  . Smoking status: Never Smoker  . Smokeless tobacco:  Never Used  Substance and Sexual Activity  . Alcohol use: Yes    Comment: occasionally   . Drug use: Never  . Sexual activity: Yes    Birth control/protection: I.U.D.  Lifestyle  . Physical activity:    Days per week: 4 days    Minutes per session: 60 min  . Stress: Not on file  Relationships  . Social connections:    Talks on phone: Not on file    Gets together: Not on file    Attends religious service: Not on file    Active member of club or organization: Not on file    Attends meetings of clubs or organizations: Not on file    Relationship  status: Not on file  . Intimate partner violence:    Fear of current or ex partner: No    Emotionally abused: No    Physically abused: No    Forced sexual activity: No  Other Topics Concern  . Not on file  Social History Narrative  . Not on file    FAMILY HISTORY: Family History  Problem Relation Age of Onset  . Learning disabilities Son   . Arthritis Mother   . Hypertension Mother   . Cancer - Colon Mother 37  . Hearing loss Father   . Arthritis Maternal Grandmother   . Hypertension Maternal Grandfather   . Diabetes Paternal Grandmother   . Prostate cancer Maternal Uncle     ALLERGIES:  is allergic to sulfa antibiotics.  MEDICATIONS:  Current Outpatient Medications  Medication Sig Dispense Refill  . Multiple Vitamins-Minerals (EYE VITAMINS PO) Take by mouth.    . tamoxifen (NOLVADEX) 20 MG tablet Take 1 tablet (20 mg total) by mouth daily. (Patient not taking: Reported on 08/19/2018) 90 tablet 3   No current facility-administered medications for this visit.     REVIEW OF SYSTEMS:   Constitutional: Denies fevers, chills or abnormal night sweats Eyes: Denies blurriness of vision, double vision or watery eyes Ears, nose, mouth, throat, and face: Denies mucositis or sore throat Respiratory: Denies cough, dyspnea or wheezes Cardiovascular: Denies palpitation, chest discomfort or lower extremity swelling Gastrointestinal:  Denies nausea, heartburn or change in bowel habits Skin: Denies abnormal skin rashes Lymphatics: Denies new lymphadenopathy or easy bruising Neurological:Denies numbness, tingling or new weaknesses Behavioral/Psych: Mood is stable, no new changes  Breast: Denies any palpable lumps or discharge All other systems were reviewed with the patient and are negative.  PHYSICAL EXAMINATION: ECOG PERFORMANCE STATUS: 1 - Symptomatic but completely ambulatory  Vitals:   08/18/18 1300  BP: (!) 114/52  Pulse: 67  Resp: 18  Temp: 98.2 F (36.8 C)  SpO2: 100%    Filed Weights   08/18/18 1300  Weight: 142 lb 1.6 oz (64.5 kg)    GENERAL:alert, no distress and comfortable SKIN: skin color, texture, turgor are normal, no rashes or significant lesions EYES: normal, conjunctiva are pink and non-injected, sclera clear OROPHARYNX:no exudate, no erythema and lips, buccal mucosa, and tongue normal  NECK: supple, thyroid normal size, non-tender, without nodularity LYMPH:  no palpable lymphadenopathy in the cervical, axillary or inguinal LUNGS: clear to auscultation and percussion with normal breathing effort HEART: regular rate & rhythm and no murmurs and no lower extremity edema ABDOMEN:abdomen soft, non-tender and normal bowel sounds Musculoskeletal:no cyanosis of digits and no clubbing  PSYCH: alert & oriented x 3 with fluent speech NEURO: no focal motor/sensory deficits  LABORATORY DATA:  I have reviewed the data as listed Lab Results  Component Value Date   WBC 7.1 05/17/2018   HGB 12.9 05/17/2018   HCT 38.4 05/17/2018   MCV 94.2 05/17/2018   PLT 254.0 05/17/2018   Lab Results  Component Value Date   NA 140 05/17/2018   K 3.6 05/17/2018   CL 103 05/17/2018   CO2 30 05/17/2018    RADIOGRAPHIC STUDIES: I have personally reviewed the radiological reports and agreed with the findings in the report.  ASSESSMENT AND PLAN:  Malignant neoplasm of upper-outer quadrant of right breast in female, estrogen receptor positive (HCC) Right breast upper central portion non-mass enhancement 5.3 cm, along the medial aspect discrete mass 1.8 cm, 2 adjacent satellite nodules together make 4 cm.  Scattered 6 to 7 mm enhancing nodules lower portion right breast, left breast 0.6 cm enhancing mass Screening detected right breast mass right breast biopsy 11 o'clock position: Invasive mammary cancer mixed histology, ER 95%, PR 95%, Ki-67 10%, HER-2 -1+ by IHC, Stage Ia  Pathology and radiology counseling: Discussed with the patient, the details of pathology  including the type of breast cancer,the clinical staging, the significance of ER, PR and HER-2/neu receptors and the implications for treatment. After reviewing the pathology in detail, we proceeded to discuss the different treatment options between surgery, radiation, chemotherapy, antiestrogen therapies.  Recommendation: 1.  Neoadjuvant therapy either with hormone therapy or chemotherapy based upon Oncotype DX test 2.  Patient absolutely wishes to undergo breast conserving surgery which may or may not be possible given the extent of her involvement.  However neoadjuvant therapy may give her a slight possibility of that. 3.  Patient will need adjuvant radiation therapy 4.  Followed by antiestrogen therapy.  I started her today on tamoxifen. Tamoxifen counseling:We discussed the risks and benefits of tamoxifen. These include but not limited to insomnia, hot flashes, mood changes, vaginal dryness, and weight gain. Although rare, serious side effects including endometrial cancer, risk of blood clots were also discussed. We strongly believe that the benefits far outweigh the risks. Patient understands these risks and consented to starting treatment.   Return to clinic based upon Oncotype test result.     All questions were answered. The patient knows to call the clinic with any problems, questions or concerns.   Nicholas Lose, MD 08/19/2018   I, Cloyde Reams Dorshimer, am acting as scribe for Nicholas Lose, MD.  I have reviewed the above documentation for accuracy and completeness, and I agree with the above.

## 2018-08-18 ENCOUNTER — Inpatient Hospital Stay: Payer: BLUE CROSS/BLUE SHIELD

## 2018-08-18 ENCOUNTER — Encounter: Payer: Self-pay | Admitting: *Deleted

## 2018-08-18 ENCOUNTER — Telehealth: Payer: Self-pay | Admitting: *Deleted

## 2018-08-18 ENCOUNTER — Encounter: Payer: Self-pay | Admitting: Licensed Clinical Social Worker

## 2018-08-18 ENCOUNTER — Inpatient Hospital Stay: Payer: BLUE CROSS/BLUE SHIELD | Attending: Hematology and Oncology | Admitting: Licensed Clinical Social Worker

## 2018-08-18 ENCOUNTER — Inpatient Hospital Stay (HOSPITAL_BASED_OUTPATIENT_CLINIC_OR_DEPARTMENT_OTHER): Payer: BLUE CROSS/BLUE SHIELD | Admitting: Hematology and Oncology

## 2018-08-18 DIAGNOSIS — Z79810 Long term (current) use of selective estrogen receptor modulators (SERMs): Secondary | ICD-10-CM | POA: Diagnosis not present

## 2018-08-18 DIAGNOSIS — Z800 Family history of malignant neoplasm of digestive organs: Secondary | ICD-10-CM

## 2018-08-18 DIAGNOSIS — C50411 Malignant neoplasm of upper-outer quadrant of right female breast: Secondary | ICD-10-CM

## 2018-08-18 DIAGNOSIS — Z8042 Family history of malignant neoplasm of prostate: Secondary | ICD-10-CM

## 2018-08-18 DIAGNOSIS — Z170 Estrogen receptor positive status [ER+]: Secondary | ICD-10-CM | POA: Diagnosis not present

## 2018-08-18 HISTORY — DX: Estrogen receptor positive status (ER+): Z17.0

## 2018-08-18 HISTORY — DX: Malignant neoplasm of upper-outer quadrant of right female breast: C50.411

## 2018-08-18 MED ORDER — TAMOXIFEN CITRATE 20 MG PO TABS: 20.0000 mg | ORAL_TABLET | Freq: Every day | ORAL | 3 refills | Status: DC

## 2018-08-18 NOTE — Telephone Encounter (Signed)
Received order for oncotype testing on core bx. Requisition faxed to [facility] and [facility]

## 2018-08-18 NOTE — Progress Notes (Signed)
REFERRING PROVIDER: Erroll Luna, MD [address] [place], [place] [address]  PRIMARY PROVIDER:  Leamon Arnt, MD  PRIMARY REASON FOR VISIT:  1. Family history of colon cancer   2. Family history of prostate cancer     HISTORY OF PRESENT ILLNESS:   Kelli [name], a 48 y.o. female, was seen for a [facility] cancer genetics consultation at the request of Dr. Brantley Stage due to her recent diagnosis of breast cancer.  Kelli [name] presents to clinic today to discuss the possibility of a hereditary predisposition to cancer, genetic testing, and to further clarify her future cancer risks, as well as potential cancer risks for family members.   In 2019, at the age of 89, Kelli [name] was diagnosed with cancer of the right breast, ER+, PR+, HER2-. Initially she reports a discussion about lumpectomy, however she had an MRI on 08/14/2018 and she reports a plan for a biopsy of her left breast, so surgical decisions may change. At this time she feels that any genetic test results would not change her mind about what type of surgery she has, although she may change her mind about this depending on the results of the MRI and biopsy. She has an appointment with Dr. Lindi Adie today, 08/18/2018, for further discussion of cancer and her treatment plan.    HORMONAL RISK FACTORS:  Menarche was at age 49.  First live birth at age 36.  OCP use for approximately 15 years.  Ovaries intact: yes.  Hysterectomy: no.  Menopausal status: premenopausal.  HRT use: 0 years. Colonoscopy: no; not examined. Mammogram within the last year: yes. Number of breast biopsies: 2.  Past Medical History:  Diagnosis Date  . Family history of colon cancer   . Family history of prostate cancer   . IUD (intrauterine device) in place 05/17/2018   June 2015  . Migraines     Past Surgical History:  Procedure Laterality Date  . BREAST BIOPSY  2004    Social History   Socioeconomic History  . Marital status: Married   Spouse name: Not on file  . Number of children: 2  . Years of education: Not on file  . Highest education level: Not on file  Occupational History  . Occupation: homemaker  Social Needs  . Financial resource strain: Not on file  . Food insecurity:    Worry: Not on file    Inability: Not on file  . Transportation needs:    Medical: Not on file    Non-medical: Not on file  Tobacco Use  . Smoking status: Never Smoker  . Smokeless tobacco: Never Used  Substance and Sexual Activity  . Alcohol use: Yes    Comment: occasionally   . Drug use: Never  . Sexual activity: Yes    Birth control/protection: I.U.D.  Lifestyle  . Physical activity:    Days per week: 4 days    Minutes per session: 60 min  . Stress: Not on file  Relationships  . Social connections:    Talks on phone: Not on file    Gets together: Not on file    Attends religious service: Not on file    Active member of club or organization: Not on file    Attends meetings of clubs or organizations: Not on file    Relationship status: Not on file  Other Topics Concern  . Not on file  Social History Narrative  . Not on file     FAMILY HISTORY:  We obtained a detailed, 4-generation family history.  Significant diagnoses are listed below: Family History  Problem Relation Age of Onset  . Learning disabilities Son   . Arthritis Mother   . Hypertension Mother   . Cancer - Colon Mother 60  . Hearing loss Father   . Arthritis Maternal Grandmother   . Hypertension Maternal Grandfather   . Diabetes Paternal Grandmother   . Prostate cancer Maternal Uncle     Kelli [name] has a son, age 80, and a daughter, age 39. She does not have brothers or sisters.  Kelli [name] mother had colon cancer at 22 which was treated with surgery, and is living at 25. The patient has two maternal uncles and a maternal aunt. One of her uncles had prostate cancer but she is unsure of the age of diagnosis. No cancers in her maternal cousins that  she is aware of. Her maternal grandmother died at 20 and maternal grandfather died at 48.  Kelli [name] father is living at 41. The patient had one paternal uncle who died at 25. No cancers in her paternal cousins that she is aware of. Her paternal grandmother died at 39, paternal grandfather died at 27.  Kelli [name] is unaware of previous family history of genetic testing for hereditary cancer risks. Patient's maternal ancestors are of [place] descent, and paternal ancestors are of [place] descent. There is no reported Ashkenazi Jewish ancestry. There is no known consanguinity.  GENETIC COUNSELING ASSESSMENT: Kelli [name] is a 48 y.o. female with a personal history of breast cancer which is somewhat suggestive of a Hereditary Cancer Predisposition Syndrome. We, therefore, discussed and recommended the following at today's visit.   DISCUSSION: We discussed that about 5-10% of breast cancer cases are hereditary with most cases due to BRCA mutations.  Other genes associated with hereditary breast cancer cases include ATM, CHEK2 and PALB2.  We reviewed the characteristics, features and inheritance patterns of hereditary cancer syndromes. We also discussed genetic testing, including the appropriate family members to test, the process of testing, insurance coverage and turn-around-time for results. We discussed the implications of a negative, positive and/or variant of uncertain significant result. We recommended Kelli [name] pursue genetic testing for the Common Hereditary Cancers Panel.   The Common Hereditary Cancers Panel offered by [facility] includes sequencing and/or deletion duplication testing of the following 47 genes: APC, ATM, AXIN2, BARD1, BMPR1A, BRCA1, BRCA2, BRIP1, CDH1, CDKN2A (p14ARF), CDKN2A (p16INK4a), CKD4, CHEK2, CTNNA1, DICER1, EPCAM (Deletion/duplication testing only), GREM1 (promoter region deletion/duplication testing only), KIT, MEN1, MLH1, MSH2, MSH3, MSH6, MUTYH, NBN, NF1, NHTL1,  PALB2, PDGFRA, PMS2, POLD1, POLE, PTEN, RAD50, RAD51C, RAD51D, SDHB, SDHC, SDHD, SMAD4, SMARCA4. STK11, TP53, TSC1, TSC2, and VHL.  The following genes were evaluated for sequence changes only: SDHA and HOXB13 c.251G>A variant only.  We discussed that if she is found to have a mutation in one of these genes, it may impact surgical decisions, and alter future medical management recommendations such as increased cancer screenings and consideration of risk reducing surgeries.  A positive result could also have implications for the patient's family members.  A Negative result would mean we were unable to identify a hereditary component to her cancer, but does not rule out the possibility of a hereditary basis for her cancer.  There could be mutations that are undetectable by current technology, or in genes not yet tested or identified to increase cancer risk.    We discussed the potential to find a Variant of Uncertain Significance or  VUS.  These are variants that have not yet been identified as pathogenic or benign, and it is unknown if this variant is associated with increased cancer risk or if this is a normal finding.  Most VUS's are reclassified to benign or likely benign.   It should not be used to make medical management decisions. With time, we suspect the lab will determine the significance of any VUS's identified if any.   Based on Kelli [name] personal history of cancer, she currently does not meet NCCN medical criteria for genetic testing. This means that she would likely pay the patient pay price for testing offered by [facility], which is $250.  PLAN:  Despite our recommendation, Kelli [name] did not wish to pursue genetic testing at today's visit. She felt like she wanted to wait until she was more clear on the results of her MRI, her biopsy and her treatment plan in general. She said she will contact me if she decides she would like to proceed with testing, and knows that we do have an option to do  a STAT panel which would have a faster turnaround time. We understand this decision, and remain available to coordinate genetic testing at any time in the future. We; therefore, recommend Kelli [name] continue to follow the cancer screening guidelines given by her primary healthcare provider.  Lastly, we encouraged Kelli [name] to remain in contact with cancer genetics annually so that we can continuously update the family history and inform her of any changes in cancer genetics and testing that may be of benefit for this family.   Ms.  [name] questions were answered to her satisfaction today. Our contact information was provided should additional questions or concerns arise. Thank you for the referral and allowing [place] to share in the care of your patient.   Faith Rogue, MS Genetic Counselor [email or web address] Phone: [phone]   The patient was seen for a total of 35 minutes in face-to-face genetic counseling.  The patient was accompanied today by her husband Kelli [name].

## 2018-08-19 ENCOUNTER — Other Ambulatory Visit: Payer: Self-pay

## 2018-08-19 ENCOUNTER — Ambulatory Visit
Admission: RE | Admit: 2018-08-19 | Discharge: 2018-08-19 | Disposition: A | Discharge status: Home / Self Care | Payer: BLUE CROSS/BLUE SHIELD | Source: Ambulatory Visit | Attending: Radiation Oncology | Admitting: Radiation Oncology

## 2018-08-19 ENCOUNTER — Telehealth: Payer: Self-pay | Admitting: Hematology and Oncology

## 2018-08-19 ENCOUNTER — Other Ambulatory Visit: Payer: BLUE CROSS/BLUE SHIELD

## 2018-08-19 ENCOUNTER — Encounter: Payer: Self-pay | Admitting: Radiation Oncology

## 2018-08-19 DIAGNOSIS — Z8042 Family history of malignant neoplasm of prostate: Secondary | ICD-10-CM | POA: Diagnosis not present

## 2018-08-19 DIAGNOSIS — Z800 Family history of malignant neoplasm of digestive organs: Secondary | ICD-10-CM | POA: Insufficient documentation

## 2018-08-19 DIAGNOSIS — Z170 Estrogen receptor positive status [ER+]: Secondary | ICD-10-CM | POA: Diagnosis not present

## 2018-08-19 DIAGNOSIS — Z79810 Long term (current) use of selective estrogen receptor modulators (SERMs): Secondary | ICD-10-CM | POA: Insufficient documentation

## 2018-08-19 DIAGNOSIS — C50411 Malignant neoplasm of upper-outer quadrant of right female breast: Secondary | ICD-10-CM | POA: Diagnosis not present

## 2018-08-19 NOTE — Progress Notes (Signed)
Radiation Oncology         [phone] ________________________________  Initial outpatient Consultation  Name: Kelli [name] MRN: [id number]  Date: 08/19/2018  DOB: February 18, 1971  CC:Leamon Arnt, MD  Erroll Luna, MD   REFERRING PHYSICIAN: Erroll Luna, MD  DIAGNOSIS:    ICD-10-CM   1. Malignant neoplasm of upper-outer quadrant of right breast in female, estrogen receptor positive ([place]) C50.411    Z17.0    Cancer Staging Malignant neoplasm of upper-outer quadrant of right breast in female, estrogen receptor positive ([place]) Staging form: Breast, AJCC 8th Edition - Clinical: Stage IA (cT1c, cN0, cM0, G2, ER+, PR+, HER2-) - Signed by Eppie Gibson, MD on 08/19/2018  CHIEF COMPLAINT: Here to discuss management of right breast cancer  HISTORY OF PRESENT ILLNESS::Kelli [name] is a 48 y.o. female who presented with breast abnormality on the following imaging: Screening mammogram on the date of 07/13/2018.  Symptoms, if any, at that time, were: She didn't have any symptoms at the time. Ultrasound of breast on 07/19/2018 revealed: Suspicious right breast mass/distortion at the 11 o'clock position. Biopsy on date of 07/22/2018 showed Breast, right, needle core biopsy, 11 o'clock, 4cmfn with invasive mammary carcinoma. Mammary carcinoma in situ. ER status: 95% positive; PR status 95% positive, Her2 status negative; Grade 2  She had a MRI Breast on 08/14/2018 that showed: Suspect significant additional disease throughout the UPPER INNER and UPPER OUTER quadrants of the RIGHT breast, probably inclusive of the recently biopsied mass in the 11 o'clock location of the RIGHT breast. Suspect discrete mass in the [place] of the [place] measuring up to 1.8 centimeters. Asymmetric nodular enhancement throughout the LOWER portion of the RIGHT breast is indeterminate. Indeterminate 6 millimeter mass in the UPPER-OUTER QUADRANT of the LEFT breast for which biopsy is recommended.    She was seen by Dr. Brantley Stage on 08/08/2018 with breast conserving surgery being discussed. She was seen by Medical Oncologist, Dr. Lindi Adie on 08/18/2018 who recommended and prescribed tamoxifen for the patient to start now.  She will have her IUD removed and use condoms for now.  She is pending more breast biopsies.  I personally reviewed the patient's imaging prior to today's visit.           PREVIOUS RADIATION THERAPY: No  PAST MEDICAL HISTORY:  has a past medical history of Family history of colon cancer, Family history of prostate cancer, IUD (intrauterine device) in place (05/17/2018), and Migraines.    PAST SURGICAL HISTORY: Past Surgical History:  Procedure Laterality Date  . BREAST BIOPSY  2004    FAMILY HISTORY: family history includes Arthritis in her maternal grandmother and mother; Cancer - Colon (age of onset: 11) in her mother; Diabetes in her paternal grandmother; Hearing loss in her father; Hypertension in her maternal grandfather and mother; Learning disabilities in her son; Prostate cancer in her maternal uncle.  SOCIAL HISTORY:  reports that she has never smoked. She has never used smokeless tobacco. She reports current alcohol use. She reports that she does not use drugs.  ALLERGIES: Sulfa antibiotics  MEDICATIONS:  Current Outpatient Medications  Medication Sig Dispense Refill  . Multiple Vitamins-Minerals (EYE VITAMINS PO) Take by mouth.    . tamoxifen (NOLVADEX) 20 MG tablet Take 1 tablet (20 mg total) by mouth daily. (Patient not taking: Reported on 08/19/2018) 90 tablet 3   No current facility-administered medications for this encounter.     REVIEW OF SYSTEMS: A 10+ POINT REVIEW OF SYSTEMS WAS OBTAINED including  neurology, dermatology, psychiatry, cardiac, respiratory, lymph, extremities, GI, GU, Musculoskeletal, constitutional, breasts, reproductive, HEENT.  All pertinent positives are noted in the HPI.  All others are negative.   PHYSICAL EXAM:  height is 5'  6" (1.676 m) and weight is 141 lb 4 oz (64.1 kg). Her oral temperature is 98 F (36.7 C). Her blood pressure is 124/57 (abnormal) and her pulse is 67. Her respiration is 18 and oxygen saturation is 100%.   General: Alert and oriented, in no acute distress HEENT: Head is normocephalic. Extraocular movements are intact.   Heart: Regular in rate and rhythm with no murmurs, rubs, or gallops. Chest: Clear to auscultation bilaterally, with no rhonchi, wheezes, or rales. Abdomen: Soft, nontender, nondistended, with no rigidity or guarding. Extremities: No cyanosis or edema. Lymphatics: see Neck Exam Skin: No concerning lesions. Musculoskeletal: symmetric strength and muscle tone throughout. Neurologic: Cranial nerves II through XII are grossly intact. No obvious focalities. Speech is fluent. Coordination is intact. Psychiatric: Judgment and insight are intact. Affect is appropriate. Breasts: Very dense heterogenous breast tissue bilaterally, it is difficult to discern the borders of her right breast disease. No adenopathy bilaterally.    ECOG = 0  0 - Asymptomatic (Fully active, able to carry on all predisease activities without restriction)  1 - Symptomatic but completely ambulatory (Restricted in physically strenuous activity but ambulatory and able to carry out work of a light or sedentary nature. For example, light housework, office work)  2 - Symptomatic, <50% in bed during the day (Ambulatory and capable of all self care but unable to carry out any work activities. Up and about more than 50% of waking hours)  3 - Symptomatic, >50% in bed, but not bedbound (Capable of only limited self-care, confined to bed or chair 50% or more of waking hours)  4 - Bedbound (Completely disabled. Cannot carry on any self-care. Totally confined to bed or chair)  5 - Death   Eustace Pen MM, Creech RH, Tormey DC, et al. [phone]). "Toxicity and response criteria of the [facility]". [place]  Oncol. 5 (6): 649-55   LABORATORY DATA:  Lab Results  Component Value Date   WBC 7.1 05/17/2018   HGB 12.9 05/17/2018   HCT 38.4 05/17/2018   MCV 94.2 05/17/2018   PLT 254.0 05/17/2018   CMP     Component Value Date/Time   NA 140 05/17/2018 1335   K 3.6 05/17/2018 1335   CL 103 05/17/2018 1335   CO2 30 05/17/2018 1335   GLUCOSE 127 (H) 05/17/2018 1335   BUN 14 05/17/2018 1335   CREATININE 0.62 05/17/2018 1335   CALCIUM 9.5 05/17/2018 1335   PROT 7.1 05/17/2018 1335   ALBUMIN 4.4 05/17/2018 1335   AST 12 05/17/2018 1335   ALT 11 05/17/2018 1335   ALKPHOS 58 05/17/2018 1335   BILITOT 1.0 05/17/2018 1335         RADIOGRAPHY: Mr Breast Bilateral W Wo Contrast Inc Cad  Result Date: 08/15/2018 CLINICAL DATA:  Recent ultrasound-guided core biopsy shows invasive mammary carcinoma and mammary carcinoma in situ in the 11 o'clock location of the RIGHT breast. E cadherin stains support a mixed phenotype malignancy. LABS:  None obtained at the time of imaging. EXAM: BILATERAL BREAST MRI WITH AND WITHOUT CONTRAST TECHNIQUE: Multiplanar, multisequence MR images of both breasts were obtained prior to and following the intravenous administration of 6 ml of Gadavist Three-dimensional MR images were rendered by post-processing of the original MR data on an independent  workstation. The three-dimensional MR images were interpreted, and findings are reported in the following complete MRI report for this study. Three dimensional images were evaluated at the independent DynaCad workstation COMPARISON:  07/22/2018 FINDINGS: Breast composition: d. Extreme fibroglandular tissue. Background parenchymal enhancement: Marked Right breast: Throughout the UPPER central portion of the RIGHT breast there is a large area of regional non mass enhancement measuring at least 5.3 x 4.3 x 3.6 centimeters. Along the MEDIAL aspect of this non mass enhancement there is a discrete mass measuring 1.8 x 1.1 x 1.6 centimeters  and 2 adjacent satellite lesions, in total measuring 4.0 x 3.0 centimeters. There are scattered 6-7 millimeter enhancing nodules within the LOWER portion of the RIGHT breast which are asymmetric compared to the LEFT. There are numerous foci of signal void throughout the RIGHT breast making it difficult to correlate the site of recent ultrasound-guided core biopsy in the UPPER-OUTER QUADRANT of the RIGHT breast on the MR images. Left breast: Within the UPPER-OUTER QUADRANT of the LEFT breast there is a 0.6 x 0.6 x 0.6 centimeter rounded enhancing mass demonstrating persistent type enhancement kinetics. Lesion is T2 bright. Lymph nodes: No abnormal appearing lymph nodes. Ancillary findings:  None. IMPRESSION: 1. Suspect significant additional disease throughout the UPPER INNER and UPPER OUTER quadrants of the RIGHT breast, probably inclusive of the recently biopsied mass in the 11 o'clock location of the RIGHT breast. 2. Suspect discrete mass in the [place] of the RIGHT breast measuring up to 1.8 centimeters. Recommend biopsy of this lesion. 3. Asymmetric nodular enhancement throughout the LOWER portion of the RIGHT breast is indeterminate. 4. Indeterminate 6 millimeter mass in the UPPER-OUTER QUADRANT of the LEFT breast for which biopsy is recommended. RECOMMENDATION: 1. Targeted ultrasound of the UPPER INNER QUADRANT of the RIGHT breast to determine if there is a sonographic correlate for the discrete 1.8 centimeter mass. If there is no sonographic correlate for this lesion I would recommend an MR guided core biopsy of the UPPER INNER QUADRANT of the RIGHT breast. 2. Recommend MR guided core biopsy of mass in the UPPER OUTER QUADRANT of the LEFT breast. BI-RADS CATEGORY  4: Suspicious. Electronically Signed   By: Nolon Nations M.D.   On: 08/15/2018 14:48   Mm Clip Placement Right  Result Date: 07/22/2018 CLINICAL DATA:  Status post ultrasound-guided core needle biopsy a right breast mass. EXAM:  DIAGNOSTIC RIGHT MAMMOGRAM POST ULTRASOUND BIOPSY COMPARISON:  Previous exam(s). FINDINGS: Mammographic images were obtained following ultrasound guided biopsy of a right breast mass. The ribbon shaped biopsy clip lies in upper outer right breast in the expected location of the mass. IMPRESSION: Well-positioned ribbon shaped biopsy clip following ultrasound-guided core needle biopsy of a right breast mass. Final Assessment: Post Procedure Mammograms for Marker Placement Electronically Signed   By: Lajean Manes M.D.   On: 07/22/2018 10:16   [place] Rt Breast Bx W Loc Dev 1st Lesion Img Bx Spec US Guide  Addendum Date: 07/25/2018   ADDENDUM REPORT: 07/25/2018 15:12 ADDENDUM: PATHOLOGY ADDENDUM: Pathology RIGHT breast needle core biopsy 11 o'clock location: Invasive mammary carcinoma and mammary carcinoma in situ. E cadherin is pending. Pathology concordance with imaging findings: Yes Recommendation: Consultation for treatment plan. Given the presence of extremely dense fibroglandular tissue and additional possible distortion seen on the original RIGHT screening mammogram, further evaluation with MRI has been recommended by Dr. Shelly Bombard. At the request of the patient, I spoke with her by telephone on 07/25/2018 at 12:24. She reports doing well  after the biopsy . Electronically Signed   By: Nolon Nations M.D.   On: 07/25/2018 15:12   Result Date: 07/25/2018 CLINICAL DATA:  Patient presents for ultrasound-guided core needle biopsy of a right breast mass. EXAM: ULTRASOUND GUIDED RIGHT BREAST CORE NEEDLE BIOPSY COMPARISON:  Previous exam(s). FINDINGS: I met with the patient and we discussed the procedure of ultrasound-guided biopsy, including benefits and alternatives. We discussed the high likelihood of a successful procedure. We discussed the risks of the procedure, including infection, bleeding, tissue injury, clip migration, and inadequate sampling. Informed written consent was given. The usual time-out protocol  was performed immediately prior to the procedure. Lesion quadrant: Upper outer quadrant Using sterile technique and 1% Lidocaine as local anesthetic, under direct ultrasound visualization, a 12 gauge spring-loaded device was used to perform biopsy of the 11 o'clock position right breast mass using an inferolateral approach. At the conclusion of the procedure a ribbon shaped tissue marker clip was deployed into the biopsy cavity. Follow up 2 view mammogram was performed and dictated separately. IMPRESSION: Ultrasound guided biopsy of a right breast mass. No apparent complications. Electronically Signed: By: Lajean Manes M.D. On: 07/22/2018 10:03      IMPRESSION/PLAN: Right breast cancer, ER+  It was a pleasure meeting the patient today. We talked about the role of radiotherapy after mastectomy in case that is what she ultimately needs, however, she understands that the likelihood that she will need post-mastectomy radiation is relatively low.  We discussed role of post-lumpectomy radiation in case the post MRI biopsies determine that she is still a candidate for breast conservation. We discussed the risks, benefits, and side effects of radiotherapy. I recommend radiotherapy to the right breast to reduce her risk of locoregional recurrence by 2/3.  We discussed that radiation would take approximately 4 weeks to complete and that I would give the patient a few weeks to heal following surgery before starting treatment planning. I will see her after has been released by Surgery and Medical Oncology. We spoke about acute effects including skin irritation and fatigue as well as much less common late effects including internal organ injury or irritation. We spoke about the latest technology that is used to minimize the risk of late effects for patients undergoing radiotherapy to the breast or chest wall. No guarantees of treatment were given. A consent form was signed and placed in the patient's chart today. The  patient is enthusiastic about proceeding with treatment. I look forward to participating in the patient's care.  I will await her referral back to me for postoperative follow-up and eventual CT simulation/treatment planning.  __________________________________________   Eppie Gibson, MD   This document serves as a record of services personally performed by Eppie Gibson, MD. It was created on her behalf by [facility], a trained medical scribe. The creation of this record is based on the scribe's personal observations and the provider's statements to them. This document has been checked and approved by the attending provider.

## 2018-08-19 NOTE — Telephone Encounter (Signed)
No los °

## 2018-08-19 NOTE — Assessment & Plan Note (Signed)
Right breast upper central portion non-mass enhancement 5.3 cm, along the medial aspect discrete mass 1.8 cm, 2 adjacent satellite nodules together make 4 cm.  Scattered 6 to 7 mm enhancing nodules lower portion right breast, left breast 0.6 cm enhancing mass Screening detected right breast mass right breast biopsy 11 o'clock position: Invasive mammary cancer mixed histology, ER 95%, PR 95%, Ki-67 10%, HER-2 -1+ by IHC, Stage Ia  Pathology and radiology counseling: Discussed with the patient, the details of pathology including the type of breast cancer,the clinical staging, the significance of ER, PR and HER-2/neu receptors and the implications for treatment. After reviewing the pathology in detail, we proceeded to discuss the different treatment options between surgery, radiation, chemotherapy, antiestrogen therapies.  Recommendation: 1.  Neoadjuvant therapy either with hormone therapy or chemotherapy based upon Oncotype DX test 2.  Patient absolutely wishes to undergo breast conserving surgery which may or may not be possible given the extent of her involvement.  However neoadjuvant therapy may give her a slight possibility of that. 3.  Patient will need adjuvant radiation therapy 4.  Followed by antiestrogen therapy.  I started her today on tamoxifen. Tamoxifen counseling:We discussed the risks and benefits of tamoxifen. These include but not limited to insomnia, hot flashes, mood changes, vaginal dryness, and weight gain. Although rare, serious side effects including endometrial cancer, risk of blood clots were also discussed. We strongly believe that the benefits far outweigh the risks. Patient understands these risks and consented to starting treatment.   Return to clinic based upon Oncotype test result.

## 2018-08-22 ENCOUNTER — Other Ambulatory Visit: Payer: BLUE CROSS/BLUE SHIELD

## 2018-08-24 ENCOUNTER — Ambulatory Visit
Admission: RE | Admit: 2018-08-24 | Discharge: 2018-08-24 | Disposition: A | Discharge status: Home / Self Care | Payer: BLUE CROSS/BLUE SHIELD | Source: Ambulatory Visit | Attending: Surgery | Admitting: Surgery

## 2018-08-24 ENCOUNTER — Other Ambulatory Visit: Payer: Self-pay | Admitting: Surgery

## 2018-08-24 DIAGNOSIS — C50211 Malignant neoplasm of upper-inner quadrant of right female breast: Secondary | ICD-10-CM | POA: Diagnosis not present

## 2018-08-24 DIAGNOSIS — N6310 Unspecified lump in the right breast, unspecified quadrant: Secondary | ICD-10-CM

## 2018-08-24 DIAGNOSIS — N6312 Unspecified lump in the right breast, upper inner quadrant: Secondary | ICD-10-CM | POA: Diagnosis not present

## 2018-08-26 ENCOUNTER — Other Ambulatory Visit: Payer: BLUE CROSS/BLUE SHIELD

## 2018-08-26 ENCOUNTER — Ambulatory Visit
Admission: RE | Admit: 2018-08-26 | Discharge: 2018-08-26 | Disposition: A | Discharge status: Home / Self Care | Payer: BLUE CROSS/BLUE SHIELD | Source: Ambulatory Visit | Attending: Surgery | Admitting: Surgery

## 2018-08-26 ENCOUNTER — Inpatient Hospital Stay: Admission: RE | Admit: 2018-08-26 | Payer: BLUE CROSS/BLUE SHIELD | Source: Ambulatory Visit

## 2018-08-26 DIAGNOSIS — R9389 Abnormal findings on diagnostic imaging of other specified body structures: Secondary | ICD-10-CM

## 2018-08-26 DIAGNOSIS — N6321 Unspecified lump in the left breast, upper outer quadrant: Secondary | ICD-10-CM | POA: Diagnosis not present

## 2018-08-26 DIAGNOSIS — D0502 Lobular carcinoma in situ of left breast: Secondary | ICD-10-CM | POA: Diagnosis not present

## 2018-08-26 MED ORDER — GADOBUTROL 1 MMOL/ML IV SOLN
6.0000 mL | Freq: Once | INTRAVENOUS | Status: AC | PRN
  Administered 2018-08-26: 6 mL via INTRAVENOUS

## 2018-08-29 ENCOUNTER — Encounter: Payer: Self-pay | Admitting: Family Medicine

## 2018-08-29 ENCOUNTER — Ambulatory Visit: Payer: BLUE CROSS/BLUE SHIELD | Admitting: Family Medicine

## 2018-08-29 VITALS — BP 118/72 | HR 63 | Temp 98.7°F | Resp 14 | Ht 66.0 in | Wt 143.8 lb

## 2018-08-29 DIAGNOSIS — L309 Dermatitis, unspecified: Secondary | ICD-10-CM | POA: Diagnosis not present

## 2018-08-29 DIAGNOSIS — C50411 Malignant neoplasm of upper-outer quadrant of right female breast: Secondary | ICD-10-CM | POA: Diagnosis not present

## 2018-08-29 DIAGNOSIS — Z30432 Encounter for removal of intrauterine contraceptive device: Secondary | ICD-10-CM | POA: Diagnosis not present

## 2018-08-29 DIAGNOSIS — Z170 Estrogen receptor positive status [ER+]: Secondary | ICD-10-CM

## 2018-08-29 MED ORDER — TRIAMCINOLONE ACETONIDE 0.1 % EX CREA: 1.0000 "application " | TOPICAL_CREAM | Freq: Two times a day (BID) | CUTANEOUS | 0 refills | Status: DC

## 2018-08-29 NOTE — Progress Notes (Signed)
Subjective  CC:  Chief Complaint  Patient presents with  . Removal of IUD    HPI: Kelli [name] is a 48 y.o. female who presents to the office today to address the problems listed above in the chief complaint.  48 yo recently diagnosed with hormone receptor positive breast cancer: needs IUD removed. This is her 2nd IUD placed in 2015 for both birth control and cycle control. Has been amenorrheic on IUD x 2 years.   Breast cancer: still in work up phase s/p 3rd breast bx ( 2 on right and 1 on left): awaiting final path. To meet with surgeon next week. Appropriately concerned. No FH of Breast cancer. Found on routine mammogram.   Eczema: had mild eczema on hands; has used low potency steroid creams in past successfully.    Assessment  1. Encounter for IUD removal   2. Malignant neoplasm of upper-outer quadrant of right breast in female, estrogen receptor positive ([place])   3. Eczema, unspecified type      Plan   IUD removal:  Removed in routine fashion w/o complications. Pt to use condoms for now while undergoing breast cancer treatment. Can discuss other non-hormonal options later if needed. Will monitor cycles in the interim.   Breast cancer: support offered. F/u with onc and breast surgeon.   Eczema: steroid cream for prn use. Education given.   Follow up: cpe due in October 2020  Visit date not found  No orders of the defined types were placed in this encounter.  Meds ordered this encounter  Medications  . triamcinolone cream (KENALOG) 0.1 %    Sig: Apply 1 application topically 2 (two) times daily. For 2 weeks, then as needed    Dispense:  45 g    Refill:  0      I reviewed the patients updated PMH, FH, and SocHx.    Patient Active Problem List   Diagnosis Date Noted  . Malignant neoplasm of upper-outer quadrant of right breast in female, estrogen receptor positive ([place]) 08/18/2018  . Family history of colon cancer   . Family history of prostate cancer   .  [place] wool spots 05/17/2018  . IUD (intrauterine device) in place 05/17/2018   Current Meds  Medication Sig  . Multiple Vitamins-Minerals (EYE VITAMINS PO) Take by mouth.  . tamoxifen (NOLVADEX) 20 MG tablet Take 1 tablet (20 mg total) by mouth daily.    Allergies: Patient is allergic to sulfa antibiotics. Family History: Patient family history includes Arthritis in her maternal grandmother and mother; Cancer - Colon (age of onset: 7) in her mother; Diabetes in her paternal grandmother; Hearing loss in her father; Hypertension in her maternal grandfather and mother; Learning disabilities in her son; Prostate cancer in her maternal uncle. Social History:  Patient  reports that she has never smoked. She has never used smokeless tobacco. She reports current alcohol use. She reports that she does not use drugs.  Review of Systems: Constitutional: Negative for fever malaise or anorexia Cardiovascular: negative for chest pain Respiratory: negative for SOB or persistent cough Gastrointestinal: negative for abdominal pain  Objective  Vitals: BP 118/72   Pulse 63   Temp 98.7 F (37.1 C) (Oral)   Resp 14   Ht 5\' 6"  (1.676 m)   Wt 143 lb 12.8 oz (65.2 kg)   SpO2 99%   BMI 23.21 kg/m  General: no acute distress , A&Ox3 HEENT: PEERL, conjunctiva normal, Oropharynx moist,neck is supple Cardiovascular:  RRR without murmur  or gallop.  Respiratory:  Good breath sounds bilaterally, CTAB with normal respiratory effort Skin:  Warm, small eczematous rash on knuckle of 3rd digit of left hand GYN: normal introitus, clear cervix with IUD string visualized.   Intrauterine Device Removal Procedure Note:  Patient given informed consent for IUD removal. She is aware that by removing the IUD, return to fertility can be immediate unless another type of birth control is used.  Appropriate time out taken.  Patient placed in the lithotomy position and the cervix brought into view using speculum. The IUD  strings were identified coming from the cervical os. These strings were grasped with ring forceps, and the IUD was removed from the uterus by using gentle traction. There were no complications and no blood loss. Patient tolerated the procedure well.       Commons side effects, risks, benefits, and alternatives for medications and treatment plan prescribed today were discussed, and the patient expressed understanding of the given instructions. Patient is instructed to call or message via MyChart if he/she has any questions or concerns regarding our treatment plan. No barriers to understanding were identified. We discussed Red Flag symptoms and signs in detail. Patient expressed understanding regarding what to do in case of urgent or emergency type symptoms.   Medication list was reconciled, printed and provided to the patient in AVS. Patient instructions and summary information was reviewed with the patient as documented in the AVS. This note was prepared with assistance of Dragon voice recognition software. Occasional wrong-word or sound-a-like substitutions may have occurred due to the inherent limitations of voice recognition software

## 2018-08-29 NOTE — Patient Instructions (Addendum)
Please follow up if symptoms do not improve or as needed.   I will be following along with you by reviewing your records.  Please let me know if you need anything.

## 2018-08-30 ENCOUNTER — Encounter (HOSPITAL_COMMUNITY): Payer: Self-pay | Admitting: Hematology and Oncology

## 2018-09-01 ENCOUNTER — Telehealth: Payer: Self-pay | Admitting: *Deleted

## 2018-09-01 NOTE — Telephone Encounter (Signed)
Received oncotype score of 10/3%. Physician team aware. Called pt with results and discussed does not need chemo at this time based on these results.  Pt will see Dr. Brantley Stage on 2/3 to discuss final surgical plan based on MR bx results. Informed pt that pending surgical plans we will plan on Tamoxifen x3 mon followed by Mammo/US and f/u with Dr. Lindi Adie if surgery is not 1st. Received verbal understanding.

## 2018-09-05 DIAGNOSIS — C50911 Malignant neoplasm of unspecified site of right female breast: Secondary | ICD-10-CM | POA: Diagnosis not present

## 2018-09-05 DIAGNOSIS — D0502 Lobular carcinoma in situ of left breast: Secondary | ICD-10-CM | POA: Diagnosis not present

## 2018-09-06 ENCOUNTER — Other Ambulatory Visit: Payer: Self-pay | Admitting: *Deleted

## 2018-09-06 DIAGNOSIS — Z170 Estrogen receptor positive status [ER+]: Principal | ICD-10-CM

## 2018-09-06 DIAGNOSIS — C50411 Malignant neoplasm of upper-outer quadrant of right female breast: Secondary | ICD-10-CM

## 2018-09-13 ENCOUNTER — Telehealth: Payer: Self-pay | Admitting: Hematology and Oncology

## 2018-09-13 NOTE — Telephone Encounter (Signed)
Scheduled apt per 2/11 sch message - pt is aware of appt date and time

## 2018-09-26 DIAGNOSIS — Z170 Estrogen receptor positive status [ER+]: Secondary | ICD-10-CM | POA: Diagnosis not present

## 2018-09-26 DIAGNOSIS — C50411 Malignant neoplasm of upper-outer quadrant of right female breast: Secondary | ICD-10-CM | POA: Diagnosis not present

## 2018-11-03 ENCOUNTER — Telehealth: Payer: Self-pay | Admitting: Hematology and Oncology

## 2018-11-03 NOTE — Telephone Encounter (Signed)
Called patient regarding rescheduling an appointment, per patient's request appoint moved from 04/17 to 04/23.  Message to provider.

## 2018-11-16 ENCOUNTER — Other Ambulatory Visit: Payer: BLUE CROSS/BLUE SHIELD

## 2018-11-17 ENCOUNTER — Ambulatory Visit: Payer: BLUE CROSS/BLUE SHIELD | Admitting: Hematology and Oncology

## 2018-11-18 ENCOUNTER — Ambulatory Visit: Payer: BLUE CROSS/BLUE SHIELD | Admitting: Hematology and Oncology

## 2018-11-21 ENCOUNTER — Ambulatory Visit
Admission: RE | Admit: 2018-11-21 | Discharge: 2018-11-21 | Disposition: A | Discharge status: Home / Self Care | Payer: BLUE CROSS/BLUE SHIELD | Source: Ambulatory Visit | Attending: Hematology and Oncology | Admitting: Hematology and Oncology

## 2018-11-21 ENCOUNTER — Other Ambulatory Visit: Payer: Self-pay

## 2018-11-21 DIAGNOSIS — C50411 Malignant neoplasm of upper-outer quadrant of right female breast: Secondary | ICD-10-CM

## 2018-11-21 DIAGNOSIS — Z170 Estrogen receptor positive status [ER+]: Principal | ICD-10-CM

## 2018-11-21 NOTE — Assessment & Plan Note (Addendum)
Right breast upper central portion non-mass enhancement 5.3 cm, along the medial aspect discrete mass 1.8 cm, 2 adjacent satellite nodules together make 4 cm.  Scattered 6 to 7 mm enhancing nodules lower portion right breast, left breast 0.6 cm enhancing mass Screening detected right breast mass right breast biopsy 11 o'clock position: Invasive mammary cancer mixed histology, ER 95%, PR 95%, Ki-67 10%, HER-2 -1+ by IHC, Stage Ia Oncotype DX recurrence score: 10, risk of distant recurrence 3%, low risk Breast MRI 08/15/2018: Large area of non-mass enhancement 5.3 x 4.3 x 3.6 cm, medial aspect and another mass 1.8 cm and 2 adjacent satellite lesions total measuring 4 x 3 cm.  Scattered 6 to 7 mm nodules lower portion right breast.  Left breast 0.6 cm enhancing mass (biopsy intermediate grade LCIS)  Treatment plan: 1.  Neoadjuvant antiestrogen therapy with tamoxifen started 08/19/2018 x 6 months 2.  Breast conserving surgery if possible  3.  Adjuvant radiation 4.  Followed by adjuvant antiestrogen therapy ------------------------------------------------------------------------------------------------------------- Mammogram and ultrasound 11/21/2018: Biopsy-proven right breast cancer 11 o'clock position: 1.4 x 1.4 x 1.2 cm (previously it was 1.4 x 1.1 x 0.8 cm) which is slightly bigger; biopsy-proven cancer right breast 2:30 position: 1.8 x 0.9 x 1.1 cm (previously 1.9 x 1.2 x 1.4 cm) slightly smaller, additional mass 1:00 right breast: 1.3 cm along with additional areas of distortion.  Radiology recommendation: Biopsy of the additional masses and distortion Will discuss her case in the breast tumor board and make a determination.  Tamoxifen toxicities:  Plan to continue 3 more months of antiestrogen therapy then obtain a breast MRI.

## 2018-11-23 ENCOUNTER — Telehealth: Payer: Self-pay | Admitting: Hematology and Oncology

## 2018-11-23 NOTE — Progress Notes (Signed)
HEMATOLOGY-ONCOLOGY [place] VISIT PROGRESS NOTE  I connected with Kelli [name] on 11/24/2018 at 11:45 AM EDT by Webex video conference and verified that I am speaking with the correct person using two identifiers.  I discussed the limitations, risks, security and privacy concerns of performing an evaluation and management service by Webex and the availability of in person appointments.  I also discussed with the patient that there may be a patient responsible charge related to this service. The patient expressed understanding and agreed to proceed.   Patient's Location: Home Physician Location: Clinic  CHIEF COMPLIANT: Follow-up of neoadjuvant tamoxifen to discuss further treatment  INTERVAL HISTORY: Kelli [name] is a 48 y.o. female with above-mentioned history of right breast cancer currently on neoadjuvant anti-estrogen therapy with tamoxifen. A mammogram and [place] from 11/21/18 showed increase in size of the right breast mass at 11 o'clock position from 1.4x1.1x0.8cm to 1.4x1.4x1.2cm, decrease in size of the right breast mass at 2:30 o'clock position from 1.9 x 1.2 x 1.4 cm to 1.8 x 0.9 x 1.1 cm, and multiple additional areas of distortion in the right breast of concern for malignancy. She presents today over Webex to discuss plan for surgery and further treatment.     Malignant neoplasm of upper-outer quadrant of right breast in female, estrogen receptor positive ([place])   07/22/2018 Initial Diagnosis    Screening detected right breast mass right breast biopsy 11 o'clock position: Invasive mammary cancer mixed histology, ER 95%, PR 95%, Ki-67 10%, HER-2 -1+ by IHC,    08/14/2018 Breast MRI    Right breast upper central portion non-mass enhancement 5.3 cm, along the medial aspect discrete mass 1.8 cm, 2 adjacent satellite nodules together make 4 cm.  Scattered 6 to 7 mm enhancing nodules lower portion right breast, left breast 0.6 cm enhancing mass     08/19/2018 Cancer Staging   Staging form: Breast, AJCC 8th Edition - Clinical: Stage IA (cT1c, cN0, cM0, G2, ER+, PR+, HER2-) - Signed by Eppie Gibson, MD on 08/19/2018     REVIEW OF SYSTEMS:   Constitutional: Denies fevers, chills or abnormal weight loss Eyes: Denies blurriness of vision Ears, nose, mouth, throat, and face: Denies mucositis or sore throat Respiratory: Denies cough, dyspnea or wheezes Cardiovascular: Denies palpitation, chest discomfort Gastrointestinal:  Denies nausea, heartburn or change in bowel habits Skin: Denies abnormal skin rashes Lymphatics: Denies new lymphadenopathy or easy bruising Neurological:Denies numbness, tingling or new weaknesses Behavioral/Psych: Mood is stable, no new changes  Extremities: No lower extremity edema Breast: denies any pain or lumps or nodules in either breasts All other systems were reviewed with the patient and are negative.  Observations/Objective:  There were no vitals filed for this visit. There is no height or weight on file to calculate BMI.  I have reviewed the data as listed CMP Latest Ref Rng & Units 05/17/2018  Glucose 70 - 99 mg/dL 127(H)  BUN 6 - 23 mg/dL 14  Creatinine 0.40 - 1.20 mg/dL 0.62  Sodium 135 - 145 mEq/L 140  Potassium 3.5 - 5.1 mEq/L 3.6  Chloride 96 - 112 mEq/L 103  CO2 19 - 32 mEq/L 30  Calcium 8.4 - 10.5 mg/dL 9.5  Total Protein 6.0 - 8.3 g/dL 7.1  Total Bilirubin 0.2 - 1.2 mg/dL 1.0  Alkaline Phos 39 - 117 U/L 58  AST 0 - 37 U/L 12  ALT 0 - 35 U/L 11    Lab Results  Component Value Date   WBC 7.1 05/17/2018  HGB 12.9 05/17/2018   HCT 38.4 05/17/2018   MCV 94.2 05/17/2018   PLT 254.0 05/17/2018   NEUTROABS 5.2 05/17/2018      Assessment Plan:  Malignant neoplasm of upper-outer quadrant of right breast in female, estrogen receptor positive ([place]) Right breast upper central portion non-mass enhancement 5.3 cm, along the medial aspect discrete mass 1.8 cm, 2 adjacent satellite nodules together make 4 cm.   Scattered 6 to 7 mm enhancing nodules lower portion right breast, left breast 0.6 cm enhancing mass Screening detected right breast mass right breast biopsy 11 o'clock position: Invasive mammary cancer mixed histology, ER 95%, PR 95%, Ki-67 10%, HER-2 -1+ by IHC, Stage Ia Oncotype DX recurrence score: 10, risk of distant recurrence 3%, low risk Breast MRI 08/15/2018: Large area of non-mass enhancement 5.3 x 4.3 x 3.6 cm, medial aspect and another mass 1.8 cm and 2 adjacent satellite lesions total measuring 4 x 3 cm.  Scattered 6 to 7 mm nodules lower portion right breast.  Left breast 0.6 cm enhancing mass (biopsy intermediate grade LCIS)  Treatment plan: 1.  Neoadjuvant antiestrogen therapy with tamoxifen started 08/19/2018 x 6 months 2.  Breast conserving surgery if possible  3.  Adjuvant radiation 4.  Followed by adjuvant antiestrogen therapy ------------------------------------------------------------------------------------------------------------- Mammogram and ultrasound 11/21/2018: Biopsy-proven right breast cancer 11 o'clock position: 1.4 x 1.4 x 1.2 cm (previously it was 1.4 x 1.1 x 0.8 cm) which is slightly bigger; biopsy-proven cancer right breast 2:30 position: 1.8 x 0.9 x 1.1 cm (previously 1.9 x 1.2 x 1.4 cm) slightly smaller, additional mass 1:00 right breast: 1.3 cm along with additional areas of distortion.  Radiology recommendation: Biopsy of the additional masses and distortion Will discuss her case in the breast tumor board and make a determination.  Tamoxifen toxicities: Denies any major side effects to tamoxifen.  Plan: I discussed with her about 3 possible scenarios. 1.  Scenario 1: If additional biopsies are positive then she will need a mastectomy.  In that situation, she should proceed with surgery. 2.  Scenario 2: Additional biopsies are benign, I still think that she should meet with surgery to discuss double lumpectomies. 3.  Scenario 3: If additional biopsies are  benign but the surgeon thinks we are to continue neoadjuvant therapy then we will continue with tamoxifen for 3 more months or until surgery is felt to be safe to perform.  If she needs mastectomy and then there is no need to perform a breast MRI. Also patient wants plastic surgery for reconstruction.  I instructed her to discuss with surgery about all of those options. Until the point of surgery, she will continue with tamoxifen.  She will stop tamoxifen 1 week prior to surgery. I informed Dawn, breast navigator to make these arrangements.   I discussed the assessment and treatment plan with the patient. The patient was provided an opportunity to ask questions and all were answered. The patient agreed with the plan and demonstrated an understanding of the instructions. The patient was advised to call back or seek an in-person evaluation if the symptoms worsen or if the condition fails to improve as anticipated.   I provided 15 minutes of face-to-face Web Ex time during this encounter.    Rulon Eisenmenger, MD 11/24/2018   I, Molly Dorshimer, am acting as scribe for Nicholas Lose, MD.  I have reviewed the above documentation for accuracy and completeness, and I agree with the above.

## 2018-11-23 NOTE — Telephone Encounter (Signed)
Called regarding upcoming Webex appointment, left patient a voicemail and Webex invite has been sent.

## 2018-11-24 ENCOUNTER — Other Ambulatory Visit: Payer: Self-pay | Admitting: Hematology and Oncology

## 2018-11-24 ENCOUNTER — Inpatient Hospital Stay: Payer: BLUE CROSS/BLUE SHIELD | Attending: Hematology and Oncology | Admitting: Hematology and Oncology

## 2018-11-24 ENCOUNTER — Other Ambulatory Visit: Payer: Self-pay | Admitting: *Deleted

## 2018-11-24 ENCOUNTER — Telehealth: Payer: Self-pay | Admitting: *Deleted

## 2018-11-24 DIAGNOSIS — Z79810 Long term (current) use of selective estrogen receptor modulators (SERMs): Secondary | ICD-10-CM

## 2018-11-24 DIAGNOSIS — Z170 Estrogen receptor positive status [ER+]: Secondary | ICD-10-CM | POA: Diagnosis not present

## 2018-11-24 DIAGNOSIS — Z923 Personal history of irradiation: Secondary | ICD-10-CM

## 2018-11-24 DIAGNOSIS — C50411 Malignant neoplasm of upper-outer quadrant of right female breast: Secondary | ICD-10-CM | POA: Diagnosis not present

## 2018-11-24 NOTE — Telephone Encounter (Signed)
Confirmed appt for stereo bx on 4/27 at 7:10. Gave instructions. Received verbal understanding.

## 2018-11-28 ENCOUNTER — Other Ambulatory Visit: Payer: Self-pay

## 2018-11-28 ENCOUNTER — Ambulatory Visit
Admission: RE | Admit: 2018-11-28 | Discharge: 2018-11-28 | Disposition: A | Discharge status: Home / Self Care | Payer: BLUE CROSS/BLUE SHIELD | Source: Ambulatory Visit | Attending: Hematology and Oncology | Admitting: Hematology and Oncology

## 2018-11-28 DIAGNOSIS — C50411 Malignant neoplasm of upper-outer quadrant of right female breast: Secondary | ICD-10-CM

## 2018-11-28 DIAGNOSIS — Z170 Estrogen receptor positive status [ER+]: Principal | ICD-10-CM

## 2018-11-28 DIAGNOSIS — C50811 Malignant neoplasm of overlapping sites of right female breast: Secondary | ICD-10-CM | POA: Diagnosis not present

## 2018-11-28 DIAGNOSIS — C50311 Malignant neoplasm of lower-inner quadrant of right female breast: Secondary | ICD-10-CM | POA: Diagnosis not present

## 2018-12-12 ENCOUNTER — Ambulatory Visit: Payer: Self-pay | Admitting: Surgery

## 2018-12-12 DIAGNOSIS — D0502 Lobular carcinoma in situ of left breast: Secondary | ICD-10-CM | POA: Diagnosis not present

## 2018-12-12 DIAGNOSIS — C50911 Malignant neoplasm of unspecified site of right female breast: Secondary | ICD-10-CM

## 2018-12-12 NOTE — H&P (Signed)
AMBERT VIRRUETA Documented: 12/12/2018 11:04 AM Location: [facility] Patient #: [id number] DOB: Oct 17, 1970 Married / Language: English / Race: White Female  History of Present Illness Marcello Moores A. Uilani Sanville MD; 12/12/2018 1:45 PM) Patient words: Patient returns for follow-up of her multifocal right breast cancer. She also has an area of LCIS left side. She is finished neoadjuvant hormonal therapy but has had minimal change in size on repeat ultrasound. She has seen plastic surgery about reconstruction. She would like to proceed with bilateral nipple sparing mastectomy with implant reconstruction after discussion today. We talked about 30 minutes following her options which included free flaps, rotational flaps and no reconstruction whatsoever. I did not feel she'll be a good breast conserving candidate after discussion and review of her studies and she agreed with that.       ADDITIONAL INFORMATION: 1. PROGNOSTIC INDICATORS Results: IMMUNOHISTOCHEMICAL AND MORPHOMETRIC ANALYSIS PERFORMED MANUALLY The tumor cells are NEGATIVE for Her2 (0). Estrogen Receptor: 80%, POSITIVE, STRONG STAINING INTENSITY Progesterone Receptor: 80%, POSITIVE, STRONG STAINING INTENSITY REFERENCE RANGE ESTROGEN RECEPTOR NEGATIVE 0% POSITIVE =>1% REFERENCE RANGE PROGESTERONE RECEPTOR NEGATIVE 0% POSITIVE =>1% All controls stained appropriately Enid Cutter MD Pathologist, Electronic Signature ( Signed 12/01/2018) FINAL DIAGNOSIS Diagnosis 1. Breast, right, needle core biopsy, medial 3 o'clock region, middle third (ribbon clip) - INVASIVE MAMMARY CARCINOMA, SEE COMMENT. - MAMMARY CARCINOMA IN SITU. 2. Breast, right, needle core biopsy, far posterior 11 o'clock region (x clip) - INVASIVE MAMMARY CARCINOMA. - MAMMARY CARCINOMA IN SITU. 1 of 3 FINAL for Prettyman, Selda ANN ([id number]) Microscopic Comment 1. and 2. The carcinoma appears grade 2. The greatest linear extent of invasive carcinoma  is 3 mm (part #1). The carcinoma in parts #1 and #2 appear similar. E-cadherin can be performed upon request. Prognostic markers will be ordered on part #1. Dr. Lyndon Code has reviewed the case. The case was called to [facility] on 11/29/18. Vicente Males MD Pathologist, Electronic Signature (Case signed 11/29/2018) Specimen Gross and Clinical Information Specimen Comment 1. Cores in formalin at 8:04; diagnosed Dec. 2019/Jan. 2020 with [facility] in 2 separate quadrants right breast; has received some neoadjuvant therapy; more biopsies requested due to desire for breast conservation 2. Cores in formalin at 8:20 Specimen(s) Obtained: 1. Breast, right, needle core biopsy, medial 3 o'clock region, middle third (ribbon clip) 2. Breast, right, needle core biopsy, far posterior 11 o'clock region (x clip) Specimen Clinical Information 1. [facility] 2. [facility] Gross 1. The specimen         48 year old female presenting for follow-up following diagnosis invasive cancer at 2 sites in the right breast (11 o'clock and 230). She has been placed on neoadjuvant tamoxifen.She desires breast conserving surgery if possible.  EXAM: DIGITAL DIAGNOSTIC RIGHT MAMMOGRAM WITH CAD AND TOMO  ULTRASOUND RIGHT BREAST  COMPARISON: Previous exam(s).  ACR Breast Density Category c: The breast tissue is heterogeneously dense, which may obscure small masses.  FINDINGS: The ribbon shaped biopsy marking clip in the upper-outer quadrant of the right breast (11 o'clock biopsy) is again identified within a distorted mass. The appearance of the distortion is more prominent than on the initial screening mammogram from December of 2019. The coil shaped biopsy marking clip is seen in the medial right breast in an area of subtle distortion. There are multiple other areas of distortion in the upper inner and upper outer quadrant of the left breast. One is located directly posterior to the biopsied mass  in the right breast at 11 o'clock.  At least 3 others are seen medial to the biopsy marking clip at 2:30. These areas of distortion correspond with the region of abnormal enhancement seen in the upper outer and upper inner right breast on the patient's MRI.  Mammographic images were processed with CAD.  Ultrasound of the biopsy-proven site of malignancy right breast at 11 o'clock, 4 cm from the nipple again demonstrates an ill-defined hypoechoic vascular mass measuring 1.4 x 1.4 x 1.2 cm, previously 1.4 x 1.1 x 0.8 cm.  The biopsy proven malignancy in the right breast at 2:30, 3 cm from the nipple demonstrates an irregular distorted hypoechoic mass measuring 1.8 x 0.9 x 1.1 cm, previously 1.9 x 1.2 x 1.4 cm.  An additional mass was seen in the right breast at 1 o'clock, 2 cm from the nipple which is taller than wide, irregular and vascular measuring 1.3 x 0.8 x 1.0 cm. This likely corresponds with 1 of the areas of distortion seen mammographically.  IMPRESSION: 1. The biopsy proven area of malignancy in the right breast at 11 o'clock is stable to slightly larger than on the ultrasound from December 2019.  2. The biopsy proven area of malignancy in the right breast at 2:30 is stable to slightly smaller than on the ultrasound from January of 2020.  3. There are multiple other areas of distortion in the upper inner and upper outer quadrants of the right breast spanning the tissue between the 2 biopsy marking clips. One of these is seen sonographically at 1 o'clock, while the others have no definite sonographic correlate. These are highly concerning for additional sites of disease.  RECOMMENDATION: If the patient desires breast conservation, I would recommend biopsy of additional sites in the right breast. Stereotactic or ultrasound guided biopsy of one or more areas of distortion in the upper inner and upper outer right breast could be performed, and/or MRI guided biopsy of the  indeterminate nodular enhancement in the lower right breast seen on MRI, if that persists following tamoxifen therapy.  I have discussed the findings and recommendations with the patient. Results were also provided in writing at the conclusion of the visit. If applicable, a reminder letter will be sent to the patient regarding the next appointment.  BI-RADS CATEGORY 6: Known biopsy-proven malignancy.   Electronically Signed By: Ammie Ferrier M.D. On: 11/21/2018 12:17.  The patient is a 48 year old female.   Allergies (Tanisha A. Owens Shark, [place]; 12/12/2018 11:04 AM) Sulfa Drugs Rash. Allergies Reconciled  Medication History (Tanisha A. Owens Shark, [place]; 12/12/2018 11:04 AM) Tamoxifen Citrate (10MG Tablet, Oral) Active. Medications Reconciled    Vitals (Tanisha A. Brown RMA; 12/12/2018 11:04 AM) 12/12/2018 11:04 AM Weight: 145 lb Height: 66in Body Surface Area: 1.74 m Body Mass Index: 23.4 kg/m  Temp.: 97.33F  Pulse: 73 (Regular)  BP: 126/72 (Sitting, Left Arm, Standard)      Physical Exam (Henri Guedes A. Marlita Keil MD; 12/12/2018 1:45 PM)  General Mental Status-Alert. General Appearance-Consistent with stated age. Hydration-Well hydrated. Voice-Normal.  Head and Neck Head-normocephalic, atraumatic with no lesions or palpable masses.  Chest and Lung Exam Chest and lung exam reveals -quiet, even and easy respiratory effort with no use of accessory muscles and on auscultation, normal breath sounds, no adventitious sounds and normal vocal resonance. Inspection Chest Wall - Normal. Back - normal.  Cardiovascular Cardiovascular examination reveals -normal heart sounds, regular rate and rhythm with no murmurs and normal pedal pulses bilaterally.  Neurologic Neurologic evaluation reveals -alert and oriented x 3 with no impairment of  recent or remote memory. Mental Status-Normal.  Lymphatic Head & Neck  General Head & Neck Lymphatics:  Bilateral - Description - Normal. Axillary  General Axillary Region: Bilateral - Description - Normal. Tenderness - Non Tender.    Assessment & Plan (Jaki Hammerschmidt A. Kratos Ruscitti MD; 12/12/2018 1:47 PM)  BREAST CANCER, RIGHT (C50.911) Impression: Lengthy discussion about options due to her multifocal disease. Given her breast size multiple lumpectomies would not be possible at this point time. She is opted for bilateral nipple sparing mastectomy with reconstruction. She understands the need for implants with this type surgery. I discussed pros and cons of this as well as long-term expectations and the need for possible revisional surgery down the road. I discussed free flaps and rotational flaps the pros and cons of these as well with her. I discussed sentinel lymph node mapping and the rationale for doing the left mastectomy as well due to the LCIS and given her young age she would be at risk of a new breast cancer in the uninvolved side. The risk of 0.5 % PER YEAR   Discussed treatment options for breast cancer to include breast conservation vs mastectomy with reconstruction. Pt has decided on mastectomy. Risk include bleeding, infection, flap necrosis, pain, numbness, recurrence, hematoma, other surgery needs. Pt understands and agrees to proceed. Risk of sentinel lymph node mapping include bleeding, infection, lymphedema, shoulder pain. stiffness, dye allergy. cosmetic deformity , blood clots, death, need for more surgery. Pt agres to proceed.  Current Plans You are being scheduled for surgery- Our schedulers will call you.  You should hear from our office's scheduling department within 5 working days about the location, date, and time of surgery. We try to make accommodations for patient's preferences in scheduling surgery, but sometimes the OR schedule or the surgeon's schedule prevents [place] from making those accommodations.  If you have not heard from our office [phone]) in 5 working days, call  the office and ask for your surgeon's nurse.  If you have other questions about your diagnosis, plan, or surgery, call the office and ask for your surgeon's nurse.  Pt Education - CCS Mastectomy HCI Pt Education - ABC (After Breast Cancer) Class Info: discussed with patient and provided information.  BREAST NEOPLASM, TIS (LCIS), LEFT (D05.02)

## 2018-12-13 DIAGNOSIS — C50411 Malignant neoplasm of upper-outer quadrant of right female breast: Secondary | ICD-10-CM | POA: Diagnosis not present

## 2018-12-13 DIAGNOSIS — Z170 Estrogen receptor positive status [ER+]: Secondary | ICD-10-CM | POA: Diagnosis not present

## 2018-12-19 ENCOUNTER — Telehealth: Payer: Self-pay | Admitting: Hematology and Oncology

## 2018-12-19 NOTE — Telephone Encounter (Signed)
Scheduled appt per sch msg. Mailed printout  °

## 2018-12-23 ENCOUNTER — Telehealth: Payer: Self-pay | Admitting: Hematology and Oncology

## 2018-12-23 NOTE — Telephone Encounter (Signed)
Per sch msg, moved 5/29to 6/29. Called and spoke with patient. Confirmed date and time

## 2018-12-30 ENCOUNTER — Encounter (HOSPITAL_BASED_OUTPATIENT_CLINIC_OR_DEPARTMENT_OTHER): Payer: Self-pay | Admitting: *Deleted

## 2018-12-30 ENCOUNTER — Ambulatory Visit: Payer: BLUE CROSS/BLUE SHIELD | Admitting: Hematology and Oncology

## 2018-12-30 ENCOUNTER — Other Ambulatory Visit: Payer: Self-pay

## 2019-01-16 ENCOUNTER — Other Ambulatory Visit (HOSPITAL_COMMUNITY)
Admission: RE | Admit: 2019-01-16 | Discharge: 2019-01-16 | Disposition: A | Discharge status: Home / Self Care | Payer: BC Managed Care – PPO | Source: Ambulatory Visit | Attending: Surgery | Admitting: Surgery

## 2019-01-16 ENCOUNTER — Encounter (HOSPITAL_BASED_OUTPATIENT_CLINIC_OR_DEPARTMENT_OTHER)
Admission: RE | Admit: 2019-01-16 | Discharge: 2019-01-16 | Disposition: A | Discharge status: Home / Self Care | Payer: BC Managed Care – PPO | Source: Ambulatory Visit | Attending: Surgery | Admitting: Surgery

## 2019-01-16 ENCOUNTER — Other Ambulatory Visit: Payer: Self-pay

## 2019-01-16 DIAGNOSIS — Z1159 Encounter for screening for other viral diseases: Secondary | ICD-10-CM | POA: Insufficient documentation

## 2019-01-16 DIAGNOSIS — Z01812 Encounter for preprocedural laboratory examination: Secondary | ICD-10-CM | POA: Insufficient documentation

## 2019-01-16 LAB — POCT PREGNANCY, URINE: Preg Test, Ur: NEGATIVE

## 2019-01-16 NOTE — Progress Notes (Signed)
Ensure pre surgery drink given with instructions to complete by 0900 dos, surgical soap given with instructions, pt verbalized understanding.

## 2019-01-17 DIAGNOSIS — C50411 Malignant neoplasm of upper-outer quadrant of right female breast: Secondary | ICD-10-CM | POA: Diagnosis not present

## 2019-01-17 DIAGNOSIS — Z170 Estrogen receptor positive status [ER+]: Secondary | ICD-10-CM | POA: Diagnosis not present

## 2019-01-17 DIAGNOSIS — Z882 Allergy status to sulfonamides status: Secondary | ICD-10-CM | POA: Diagnosis not present

## 2019-01-17 DIAGNOSIS — D0502 Lobular carcinoma in situ of left breast: Secondary | ICD-10-CM | POA: Diagnosis not present

## 2019-01-17 DIAGNOSIS — Z01812 Encounter for preprocedural laboratory examination: Secondary | ICD-10-CM | POA: Diagnosis not present

## 2019-01-17 DIAGNOSIS — Z9221 Personal history of antineoplastic chemotherapy: Secondary | ICD-10-CM | POA: Diagnosis not present

## 2019-01-17 LAB — COMPREHENSIVE METABOLIC PANEL
ALT: 19 U/L (ref 0–44)
AST: 20 U/L (ref 15–41)
Albumin: 3.7 g/dL (ref 3.5–5.0)
Alkaline Phosphatase: 41 U/L (ref 38–126)
Anion gap: 8 (ref 5–15)
BUN: 12 mg/dL (ref 6–20)
CO2: 24 mmol/L (ref 22–32)
Calcium: 9.3 mg/dL (ref 8.9–10.3)
Chloride: 107 mmol/L (ref 98–111)
Creatinine, Ser: 0.83 mg/dL (ref 0.44–1.00)
GFR calc Af Amer: >60 mL/min (ref >60)
GFR calc non Af Amer: >60 mL/min (ref >60)
Glucose, Bld: 117 mg/dL — ABNORMAL HIGH (ref 70–99)
Potassium: 4.1 mmol/L (ref 3.5–5.1)
Sodium: 139 mmol/L (ref 135–145)
Total Bilirubin: 0.9 mg/dL (ref 0.3–1.2)
Total Protein: 6.9 g/dL (ref 6.5–8.1)

## 2019-01-17 LAB — CBC WITH DIFFERENTIAL/PLATELET
Abs Immature Granulocytes: 0.01 10*3/uL (ref 0.00–0.07)
Basophils Absolute: 0 10*3/uL (ref 0.0–0.1)
Basophils Relative: 1 %
Eosinophils Absolute: 0 10*3/uL (ref 0.0–0.5)
Eosinophils Relative: 0 %
HCT: 38.1 % (ref 36.0–46.0)
Hemoglobin: 12.6 g/dL (ref 12.0–15.0)
Immature Granulocytes: 0 %
Lymphocytes Relative: 28 %
Lymphs Abs: 1.7 10*3/uL (ref 0.7–4.0)
MCH: 31.1 pg (ref 26.0–34.0)
MCHC: 33.1 g/dL (ref 30.0–36.0)
MCV: 94.1 fL (ref 80.0–100.0)
Monocytes Absolute: 0.5 10*3/uL (ref 0.1–1.0)
Monocytes Relative: 8 %
Neutro Abs: 3.8 10*3/uL (ref 1.7–7.7)
Neutrophils Relative %: 63 %
Platelets: 236 10*3/uL (ref 150–400)
RBC: 4.05 MIL/uL (ref 3.87–5.11)
RDW: 12.2 % (ref 11.5–15.5)
WBC: 6.1 10*3/uL (ref 4.0–10.5)
nRBC: 0 % (ref 0.0–0.2)

## 2019-01-17 LAB — NOVEL CORONAVIRUS, NAA (HOSP ORDER, SEND-OUT TO REF LAB; TAT 18-24 HRS): SARS-CoV-2, NAA: NOT DETECTED

## 2019-01-19 ENCOUNTER — Other Ambulatory Visit: Payer: Self-pay

## 2019-01-19 ENCOUNTER — Encounter (HOSPITAL_BASED_OUTPATIENT_CLINIC_OR_DEPARTMENT_OTHER)
Admission: RE | Disposition: A | Discharge status: Home / Self Care | Payer: Self-pay | Source: Home / Self Care | Attending: Surgery

## 2019-01-19 ENCOUNTER — Encounter (HOSPITAL_BASED_OUTPATIENT_CLINIC_OR_DEPARTMENT_OTHER): Payer: Self-pay | Admitting: Anesthesiology

## 2019-01-19 ENCOUNTER — Encounter (HOSPITAL_COMMUNITY)
Admission: RE | Admit: 2019-01-19 | Discharge: 2019-01-19 | Disposition: A | Discharge status: Home / Self Care | Payer: BC Managed Care – PPO | Source: Ambulatory Visit | Attending: Surgery | Admitting: Surgery

## 2019-01-19 ENCOUNTER — Ambulatory Visit (HOSPITAL_BASED_OUTPATIENT_CLINIC_OR_DEPARTMENT_OTHER): Payer: BC Managed Care – PPO | Admitting: Anesthesiology

## 2019-01-19 ENCOUNTER — Ambulatory Visit (HOSPITAL_COMMUNITY): Payer: BC Managed Care – PPO

## 2019-01-19 ENCOUNTER — Ambulatory Visit (HOSPITAL_BASED_OUTPATIENT_CLINIC_OR_DEPARTMENT_OTHER)
Admission: RE | Admit: 2019-01-19 | Discharge: 2019-01-20 | Disposition: A | Discharge status: Home / Self Care | Payer: BC Managed Care – PPO | Attending: Surgery | Admitting: Surgery

## 2019-01-19 DIAGNOSIS — G8918 Other acute postprocedural pain: Secondary | ICD-10-CM | POA: Diagnosis not present

## 2019-01-19 DIAGNOSIS — G43909 Migraine, unspecified, not intractable, without status migrainosus: Secondary | ICD-10-CM | POA: Diagnosis not present

## 2019-01-19 DIAGNOSIS — Z01812 Encounter for preprocedural laboratory examination: Secondary | ICD-10-CM | POA: Insufficient documentation

## 2019-01-19 DIAGNOSIS — Z9221 Personal history of antineoplastic chemotherapy: Secondary | ICD-10-CM | POA: Diagnosis not present

## 2019-01-19 DIAGNOSIS — Z853 Personal history of malignant neoplasm of breast: Secondary | ICD-10-CM | POA: Diagnosis present

## 2019-01-19 DIAGNOSIS — C50411 Malignant neoplasm of upper-outer quadrant of right female breast: Secondary | ICD-10-CM | POA: Diagnosis not present

## 2019-01-19 DIAGNOSIS — C50911 Malignant neoplasm of unspecified site of right female breast: Secondary | ICD-10-CM

## 2019-01-19 DIAGNOSIS — D0502 Lobular carcinoma in situ of left breast: Secondary | ICD-10-CM | POA: Diagnosis not present

## 2019-01-19 DIAGNOSIS — Z882 Allergy status to sulfonamides status: Secondary | ICD-10-CM | POA: Diagnosis not present

## 2019-01-19 DIAGNOSIS — Z170 Estrogen receptor positive status [ER+]: Secondary | ICD-10-CM | POA: Insufficient documentation

## 2019-01-19 HISTORY — PX: NIPPLE SPARING MASTECTOMY WITH SENTINEL LYMPH NODE BIOPSY: SHX6826

## 2019-01-19 HISTORY — PX: BREAST RECONSTRUCTION WITH PLACEMENT OF TISSUE EXPANDER AND ALLODERM: SHX6805

## 2019-01-19 SURGERY — NIPPLE SPARING MASTECTOMY WITH SENTINEL LYMPH NODE BIOPSY
Anesthesia: Regional | Site: Chest | Laterality: Bilateral

## 2019-01-19 MED ORDER — FENTANYL CITRATE (PF) 100 MCG/2ML IJ SOLN
25.0000 ug | INTRAMUSCULAR | Status: DC | PRN
  Administered 2019-01-19: 50 ug via INTRAVENOUS

## 2019-01-19 MED ORDER — OXYCODONE HCL 5 MG PO TABS: 5.0000 mg | ORAL_TABLET | ORAL | 0 refills | Status: DC | PRN

## 2019-01-19 MED ORDER — METHOCARBAMOL 500 MG PO TABS
500.0000 mg | ORAL_TABLET | Freq: Three times a day (TID) | ORAL | Status: DC | PRN
  Administered 2019-01-19 – 2019-01-20 (×2): 500 mg via ORAL
  Filled 2019-01-19 (×2): qty 1

## 2019-01-19 MED ORDER — ENOXAPARIN SODIUM 40 MG/0.4ML ~~LOC~~ SOLN
40.0000 mg | SUBCUTANEOUS | Status: DC
  Administered 2019-01-20: 40 mg via SUBCUTANEOUS
  Filled 2019-01-19: qty 0.4

## 2019-01-19 MED ORDER — FENTANYL CITRATE (PF) 100 MCG/2ML IJ SOLN
INTRAMUSCULAR | Status: AC
  Filled 2019-01-19: qty 2

## 2019-01-19 MED ORDER — ONDANSETRON HCL 4 MG/2ML IJ SOLN
INTRAMUSCULAR | Status: DC | PRN
  Administered 2019-01-19: 4 mg via INTRAVENOUS

## 2019-01-19 MED ORDER — EPHEDRINE SULFATE 50 MG/ML IJ SOLN
INTRAMUSCULAR | Status: DC | PRN
  Administered 2019-01-19 (×2): 10 mg via INTRAVENOUS

## 2019-01-19 MED ORDER — KETOROLAC TROMETHAMINE 30 MG/ML IJ SOLN
INTRAMUSCULAR | Status: AC
  Filled 2019-01-19: qty 1

## 2019-01-19 MED ORDER — PROPOFOL 10 MG/ML IV BOLUS
INTRAVENOUS | Status: DC | PRN
  Administered 2019-01-19: 130 mg via INTRAVENOUS

## 2019-01-19 MED ORDER — HYDROMORPHONE HCL 1 MG/ML IJ SOLN: 0.5000 mg | INTRAMUSCULAR | Status: DC | PRN

## 2019-01-19 MED ORDER — GABAPENTIN 300 MG PO CAPS
ORAL_CAPSULE | ORAL | Status: AC
  Filled 2019-01-19: qty 1

## 2019-01-19 MED ORDER — SODIUM CHLORIDE (PF) 0.9 % IJ SOLN
INTRAMUSCULAR | Status: AC
  Filled 2019-01-19: qty 10

## 2019-01-19 MED ORDER — LIDOCAINE 2% (20 MG/ML) 5 ML SYRINGE
INTRAMUSCULAR | Status: DC | PRN
  Administered 2019-01-19: 60 mg via INTRAVENOUS

## 2019-01-19 MED ORDER — CEFAZOLIN SODIUM-DEXTROSE 2-4 GM/100ML-% IV SOLN
2.0000 g | INTRAVENOUS | Status: AC
  Administered 2019-01-19: 2 g via INTRAVENOUS

## 2019-01-19 MED ORDER — MIDAZOLAM HCL 2 MG/2ML IJ SOLN
INTRAMUSCULAR | Status: AC
  Filled 2019-01-19: qty 2

## 2019-01-19 MED ORDER — TAMOXIFEN CITRATE 20 MG PO TABS: 20.0000 mg | ORAL_TABLET | Freq: Every day | ORAL | 3 refills | Status: DC

## 2019-01-19 MED ORDER — OXYCODONE HCL 5 MG PO TABS
5.0000 mg | ORAL_TABLET | ORAL | Status: DC | PRN
  Administered 2019-01-19: 5 mg via ORAL
  Filled 2019-01-19: qty 1

## 2019-01-19 MED ORDER — ACETAMINOPHEN 500 MG PO TABS
1000.0000 mg | ORAL_TABLET | ORAL | Status: AC
  Administered 2019-01-19: 1000 mg via ORAL

## 2019-01-19 MED ORDER — LIDOCAINE 2% (20 MG/ML) 5 ML SYRINGE
INTRAMUSCULAR | Status: AC
  Filled 2019-01-19: qty 5

## 2019-01-19 MED ORDER — ONDANSETRON HCL 4 MG/2ML IJ SOLN
INTRAMUSCULAR | Status: AC
  Filled 2019-01-19: qty 2

## 2019-01-19 MED ORDER — ACETAMINOPHEN 500 MG PO TABS
ORAL_TABLET | ORAL | Status: AC
  Filled 2019-01-19: qty 2

## 2019-01-19 MED ORDER — CEFAZOLIN SODIUM-DEXTROSE 2-4 GM/100ML-% IV SOLN
INTRAVENOUS | Status: AC
  Filled 2019-01-19: qty 100

## 2019-01-19 MED ORDER — DEXAMETHASONE SODIUM PHOSPHATE 10 MG/ML IJ SOLN
INTRAMUSCULAR | Status: AC
  Filled 2019-01-19: qty 1

## 2019-01-19 MED ORDER — TECHNETIUM TC 99M SULFUR COLLOID FILTERED
1.0000 | Freq: Once | INTRAVENOUS | Status: AC | PRN
  Administered 2019-01-19: 1 via INTRADERMAL

## 2019-01-19 MED ORDER — DOXYCYCLINE HYCLATE 50 MG PO CAPS: 50.0000 mg | ORAL_CAPSULE | Freq: Two times a day (BID) | ORAL | 0 refills | Status: DC

## 2019-01-19 MED ORDER — KETOROLAC TROMETHAMINE 30 MG/ML IJ SOLN
30.0000 mg | Freq: Three times a day (TID) | INTRAMUSCULAR | Status: AC
  Administered 2019-01-19 – 2019-01-20 (×3): 30 mg via INTRAVENOUS
  Filled 2019-01-19 (×3): qty 1

## 2019-01-19 MED ORDER — SODIUM CHLORIDE 0.9 % IV SOLN
INTRAVENOUS | Status: DC | PRN
  Administered 2019-01-19: 1000 mL

## 2019-01-19 MED ORDER — METHYLENE BLUE 0.5 % INJ SOLN
INTRAVENOUS | Status: AC
  Filled 2019-01-19: qty 10

## 2019-01-19 MED ORDER — CEFAZOLIN SODIUM-DEXTROSE 1-4 GM/50ML-% IV SOLN
1.0000 g | Freq: Three times a day (TID) | INTRAVENOUS | Status: DC
  Administered 2019-01-19 – 2019-01-20 (×2): 1 g via INTRAVENOUS
  Filled 2019-01-19 (×2): qty 50

## 2019-01-19 MED ORDER — ROPIVACAINE HCL 5 MG/ML IJ SOLN
INTRAMUSCULAR | Status: DC | PRN
  Administered 2019-01-19: 15 mL via PERINEURAL
  Administered 2019-01-19: 20 mL via PERINEURAL

## 2019-01-19 MED ORDER — FENTANYL CITRATE (PF) 100 MCG/2ML IJ SOLN
50.0000 ug | INTRAMUSCULAR | Status: AC | PRN
  Administered 2019-01-19 (×7): 50 ug via INTRAVENOUS

## 2019-01-19 MED ORDER — ONDANSETRON 4 MG PO TBDP: 4.0000 mg | ORAL_TABLET | Freq: Four times a day (QID) | ORAL | Status: DC | PRN

## 2019-01-19 MED ORDER — CHLORHEXIDINE GLUCONATE CLOTH 2 % EX PADS: 6.0000 | MEDICATED_PAD | Freq: Once | CUTANEOUS | Status: DC

## 2019-01-19 MED ORDER — METHOCARBAMOL 500 MG PO TABS: 500.0000 mg | ORAL_TABLET | Freq: Three times a day (TID) | ORAL | 0 refills | Status: DC | PRN

## 2019-01-19 MED ORDER — MIDAZOLAM HCL 2 MG/2ML IJ SOLN
1.0000 mg | INTRAMUSCULAR | Status: DC | PRN
  Administered 2019-01-19 (×2): 2 mg via INTRAVENOUS

## 2019-01-19 MED ORDER — SUCCINYLCHOLINE CHLORIDE 20 MG/ML IJ SOLN
INTRAMUSCULAR | Status: DC | PRN
  Administered 2019-01-19: 120 mg via INTRAVENOUS

## 2019-01-19 MED ORDER — DEXAMETHASONE SODIUM PHOSPHATE 4 MG/ML IJ SOLN
INTRAMUSCULAR | Status: DC | PRN
  Administered 2019-01-19: 10 mg via INTRAVENOUS

## 2019-01-19 MED ORDER — ONDANSETRON HCL 4 MG/2ML IJ SOLN: 4.0000 mg | Freq: Four times a day (QID) | INTRAMUSCULAR | Status: DC | PRN

## 2019-01-19 MED ORDER — CLONIDINE HCL (ANALGESIA) 100 MCG/ML EP SOLN
EPIDURAL | Status: DC | PRN
  Administered 2019-01-19 (×2): 50 ug

## 2019-01-19 MED ORDER — KCL IN DEXTROSE-NACL 20-5-0.45 MEQ/L-%-% IV SOLN
INTRAVENOUS | Status: DC
  Administered 2019-01-19: 18:00:00 via INTRAVENOUS
  Filled 2019-01-19: qty 1000

## 2019-01-19 MED ORDER — GABAPENTIN 300 MG PO CAPS
300.0000 mg | ORAL_CAPSULE | ORAL | Status: AC
  Administered 2019-01-19: 300 mg via ORAL

## 2019-01-19 MED ORDER — LACTATED RINGERS IV SOLN
INTRAVENOUS | Status: DC
  Administered 2019-01-19 (×3): via INTRAVENOUS

## 2019-01-19 SURGICAL SUPPLY — 87 items
ALLODERM 16X20 PERFORATED (Tissue) ×2 IMPLANT
ALLOGRAFT PERF 16X20 1.6+/-0.4 (Tissue) ×6 IMPLANT
APPLIER CLIP 9.375 MED OPEN (MISCELLANEOUS) ×4
BAG DECANTER FOR FLEXI CONT (MISCELLANEOUS) ×4 IMPLANT
BINDER BREAST LRG (GAUZE/BANDAGES/DRESSINGS) ×4 IMPLANT
BINDER BREAST MEDIUM (GAUZE/BANDAGES/DRESSINGS) IMPLANT
BINDER BREAST XLRG (GAUZE/BANDAGES/DRESSINGS) IMPLANT
BINDER BREAST XXLRG (GAUZE/BANDAGES/DRESSINGS) IMPLANT
BLADE SURG 10 STRL SS (BLADE) ×4 IMPLANT
BLADE SURG 15 STRL LF DISP TIS (BLADE) ×2 IMPLANT
BLADE SURG 15 STRL SS (BLADE) ×2
BNDG GAUZE ELAST 4 BULKY (GAUZE/BANDAGES/DRESSINGS) ×8 IMPLANT
CANISTER SUCT 1200ML W/VALVE (MISCELLANEOUS) ×8 IMPLANT
CHLORAPREP W/TINT 26 (MISCELLANEOUS) ×4 IMPLANT
CLIP APPLIE 9.375 MED OPEN (MISCELLANEOUS) ×2 IMPLANT
COUNTER NEEDLE 1200 MAGNETIC (NEEDLE) IMPLANT
COVER BACK TABLE REUSABLE LG (DRAPES) ×4 IMPLANT
COVER MAYO STAND REUSABLE (DRAPES) ×8 IMPLANT
COVER PROBE W GEL 5X96 (DRAPES) ×4 IMPLANT
DERMABOND ADVANCED (GAUZE/BANDAGES/DRESSINGS) ×4
DERMABOND ADVANCED .7 DNX12 (GAUZE/BANDAGES/DRESSINGS) ×4 IMPLANT
DRAIN CHANNEL 15F RND FF W/TCR (WOUND CARE) ×8 IMPLANT
DRAIN CHANNEL 19F RND (DRAIN) ×8 IMPLANT
DRAPE TOP ARMCOVERS (MISCELLANEOUS) ×4 IMPLANT
DRAPE U-SHAPE 76X120 STRL (DRAPES) ×4 IMPLANT
DRAPE UTILITY XL STRL (DRAPES) ×4 IMPLANT
DRSG PAD ABDOMINAL 8X10 ST (GAUZE/BANDAGES/DRESSINGS) ×12 IMPLANT
DRSG TEGADERM 2-3/8X2-3/4 SM (GAUZE/BANDAGES/DRESSINGS) IMPLANT
DRSG TEGADERM 4X10 (GAUZE/BANDAGES/DRESSINGS) ×12 IMPLANT
ELECT BLADE 4.0 EZ CLEAN MEGAD (MISCELLANEOUS) ×8
ELECT COATED BLADE 2.86 ST (ELECTRODE) ×4 IMPLANT
ELECT REM PT RETURN 9FT ADLT (ELECTROSURGICAL) ×4
ELECTRODE BLDE 4.0 EZ CLN MEGD (MISCELLANEOUS) ×4 IMPLANT
ELECTRODE REM PT RTRN 9FT ADLT (ELECTROSURGICAL) ×2 IMPLANT
EVACUATOR SILICONE 100CC (DRAIN) ×16 IMPLANT
EXPANDER TISSUE FV FOURTE 400 (Prosthesis & Implant Plastic) ×4 IMPLANT
GLOVE BIO SURGEON STRL SZ 6 (GLOVE) ×12 IMPLANT
GLOVE BIO SURGEON STRL SZ 6.5 (GLOVE) IMPLANT
GLOVE BIO SURGEON STRL SZ7 (GLOVE) ×12 IMPLANT
GLOVE BIO SURGEON STRL SZ8 (GLOVE) ×4 IMPLANT
GLOVE BIO SURGEONS STRL SZ 6.5 (GLOVE)
GLOVE BIOGEL PI IND STRL 6.5 (GLOVE) IMPLANT
GLOVE BIOGEL PI IND STRL 7.0 (GLOVE) ×4 IMPLANT
GLOVE BIOGEL PI IND STRL 8 (GLOVE) ×2 IMPLANT
GLOVE BIOGEL PI INDICATOR 6.5 (GLOVE)
GLOVE BIOGEL PI INDICATOR 7.0 (GLOVE) ×4
GLOVE BIOGEL PI INDICATOR 8 (GLOVE) ×2
GLOVE ECLIPSE 8.0 STRL XLNG CF (GLOVE) ×4 IMPLANT
GLOVE EXAM NITRILE MD LF STRL (GLOVE) ×4 IMPLANT
GOWN STRL REUS W/ TWL LRG LVL3 (GOWN DISPOSABLE) ×8 IMPLANT
GOWN STRL REUS W/TWL LRG LVL3 (GOWN DISPOSABLE) ×8
KIT FILL SYSTEM UNIVERSAL (SET/KITS/TRAYS/PACK) IMPLANT
KIT MARKER MARGIN INK (KITS) ×4 IMPLANT
MARKER SKIN DUAL TIP RULER LAB (MISCELLANEOUS) ×4 IMPLANT
NDL SAFETY ECLIPSE 18X1.5 (NEEDLE) IMPLANT
NEEDLE HYPO 18GX1.5 SHARP (NEEDLE)
NEEDLE HYPO 25X1 1.5 SAFETY (NEEDLE) IMPLANT
PENCIL BUTTON HOLSTER BLD 10FT (ELECTRODE) ×8 IMPLANT
PIN SAFETY STERILE (MISCELLANEOUS) ×8 IMPLANT
PUNCH BIOPSY DERMAL 4MM (MISCELLANEOUS) IMPLANT
SHEET MEDIUM DRAPE 40X70 STRL (DRAPES) ×8 IMPLANT
SLEEVE SCD COMPRESS KNEE MED (MISCELLANEOUS) ×4 IMPLANT
SPONGE LAP 18X18 RF (DISPOSABLE) ×16 IMPLANT
STAPLER VISISTAT 35W (STAPLE) ×4 IMPLANT
SUT CHROMIC 4 0 PS 2 18 (SUTURE) ×20 IMPLANT
SUT ETHILON 2 0 FS 18 (SUTURE) ×8 IMPLANT
SUT MNCRL AB 4-0 PS2 18 (SUTURE) ×8 IMPLANT
SUT PDS AB 2-0 CT2 27 (SUTURE) IMPLANT
SUT SILK 2 0 SH (SUTURE) IMPLANT
SUT VIC AB 3-0 SH 27 (SUTURE) ×6
SUT VIC AB 3-0 SH 27X BRD (SUTURE) ×6 IMPLANT
SUT VICRYL 0 CT-2 (SUTURE) ×16 IMPLANT
SUT VICRYL 4-0 PS2 18IN ABS (SUTURE) ×8 IMPLANT
SUT VLOC 180 0 24IN GS25 (SUTURE) ×8 IMPLANT
SYR 10ML LL (SYRINGE) IMPLANT
SYR 50ML LL SCALE MARK (SYRINGE) ×4 IMPLANT
SYR BULB IRRIGATION 50ML (SYRINGE) ×8 IMPLANT
SYR CONTROL 10ML LL (SYRINGE) IMPLANT
TAPE MEASURE VINYL STERILE (MISCELLANEOUS) IMPLANT
TISSUE EXPNDR FV FOURTE 400 (Prosthesis & Implant Plastic) ×8 IMPLANT
TOWEL GREEN STERILE FF (TOWEL DISPOSABLE) ×8 IMPLANT
TRAY DSU PREP LF (CUSTOM PROCEDURE TRAY) ×4 IMPLANT
TRAY FOLEY W/BAG SLVR 14FR LF (SET/KITS/TRAYS/PACK) ×4 IMPLANT
TUBE CONNECTING 20'X1/4 (TUBING) ×1
TUBE CONNECTING 20X1/4 (TUBING) ×3 IMPLANT
UNDERPAD 30X30 (UNDERPADS AND DIAPERS) ×8 IMPLANT
YANKAUER SUCT BULB TIP NO VENT (SUCTIONS) ×4 IMPLANT

## 2019-01-19 NOTE — Progress Notes (Signed)
Assisted Dr. Woodrum with right, left, ultrasound guided, pectoralis block. Side rails up, monitors on throughout procedure. See vital signs in flow sheet. Tolerated Procedure well. 

## 2019-01-19 NOTE — Progress Notes (Signed)
Assisted Edward, nuc med tech, with nuc med injections. Side rails up, monitors on throughout procedure. Provided emotional support. See vital signs in flow sheet. Tolerated Procedure well.

## 2019-01-19 NOTE — Anesthesia Procedure Notes (Signed)
Procedure Name: Intubation Date/Time: 01/19/2019 11:18 AM Performed by: Maryella Shivers, CRNA Pre-anesthesia Checklist: Patient identified, Emergency Drugs available, Suction available and Patient being monitored Patient Re-evaluated:Patient Re-evaluated prior to induction Oxygen Delivery Method: Circle system utilized Preoxygenation: Pre-oxygenation with 100% oxygen Induction Type: IV induction Ventilation: Mask ventilation without difficulty Laryngoscope Size: Mac and 3 Grade View: Grade I Tube type: Oral Tube size: 7.0 mm Number of attempts: 1 Airway Equipment and Method: Stylet and Oral airway Placement Confirmation: ETT inserted through vocal cords under direct vision,  positive ETCO2 and breath sounds checked- equal and bilateral Secured at: 21 cm Tube secured with: Tape Dental Injury: Teeth and Oropharynx as per pre-operative assessment

## 2019-01-19 NOTE — Anesthesia Postprocedure Evaluation (Signed)
Anesthesia Post Note  Patient: Kelli [name]  Procedure(s) Performed: BILATERAL NIPPLE SPARING MASTECTOMY WITH RIGHT SENTINEL LYMPH NODE MAPPING (Bilateral Breast) BILATERAL BREAST RECONSTRUCTION WITH PLACEMENT OF TISSUE EXPANDERS AND ALLODERM (Bilateral Chest)     Patient location during evaluation: PACU Anesthesia Type: Regional and General Level of consciousness: awake and alert Pain management: pain level controlled Vital Signs Assessment: post-procedure vital signs reviewed and stable Respiratory status: spontaneous breathing, nonlabored ventilation, respiratory function stable and patient connected to nasal cannula oxygen Cardiovascular status: blood pressure returned to baseline and stable Postop Assessment: no apparent nausea or vomiting Anesthetic complications: no    Last Vitals:  Vitals:   01/19/19 1600 01/19/19 1615  BP: (!) 117/56 120/65  Pulse: 85 87  Resp: (!) 21 (!) 25  Temp:    SpO2: 100% 100%    Last Pain:  Vitals:   01/19/19 1615  TempSrc:   PainSc: 6                  Haydin Calandra L Javarius Tsosie

## 2019-01-19 NOTE — Op Note (Signed)
Preoperative diagnosis: Stage I right breast cancer and LCIS left breast  Postoperative diagnosis: Same  Procedure: Bilateral nipple preserving mastectomy with right axillary sentinel lymph node mapping  Surgeon: Erroll Luna, MD  Assistant: Dr. Iran Planas MD  EBL: 250 cc  Drains: Please see Dr. Iran Planas operative report  IV fluids: Per record  Specimen: Bilateral breast tissue which was oriented with ink and 2 right axillary sentinel nodes to pathology     Indications for procedure: The patient presents for bilateral nipple preserving mastectomy after being treated with neoadjuvant tamoxifen for stage I right breast cancer and left breast LCIS.  She has very dense breast tissue by MRI and after discussion of all of her options she desired nipple preservation with bilateral reconstruction as well as left mastectomy due to LCIS.The surgical and non surgical options have been discussed with the patient.  Risks of surgery include bleeding,  Infection,  Flap necrosis,  Tissue loss, nipple loss and loss of sensation chronic pain, death, Numbness,  And the need for additional procedures.  Reconstruction options also have been discussed with the patient as well.  The patient agrees to proceed.Sentinel lymph node mapping and dissection has been discussed with the patient.  Risk of bleeding,  Infection, lymphedema seroma formation,  Additional procedures,,  Shoulder weakness ,  Shoulder stiffness,  Nerve and blood vessel injury and reaction to the mapping dyes have been discussed.  Alternatives to surgery have been discussed with the patient.  The patient agrees to proceed.   Description of procedure: The patient was met in the holding area.  She underwent bilateral pectoral blocks per anesthesia.  She underwent right breast injection of technetium for mapping.  All questions were answered and she was taken the operating room.  She was placed supine.  Induction of general esthesia upper chest region  bilaterally was prepped and draped in sterile fashion timeout was done.  Proper patient procedure were verified.  She received preoperative antibiotics.  The left side was done first.  An inframammary incision was made and dissection was carried down into the pectoralis major muscle was identified.  The breast was dissected off the anterior surface of the pectoralis muscle up to the clavicle and over to the sternum and then the lateral to the latissimus muscle.  We then mobilized the breast from the anterior skin.  The stomach skin looks and using cautery.  Once we dissected the nipple off of the breast using sharp dissection and biopsy was taken the left nipple and it was benign.  We then completed the dissection mobilizing the breast from the anterior interface with the skin with grossly negative margins.  The specimen is oriented and passed off the field.  Hemostasis achieved.  The right side was then done in a similar fashion.  The right axilla was done first.  Incision was made to help the neoprobe in the right axilla.  There were 2 hot deep level 1 nodes removed and these were sent to pathology.  Background counts approached 0.  This was made hemostatic and closed with 3-0 Vicryl and 4-0 Monocryl.  The right mastectomy was then performed.  Through the inframammary incision dissection was carried down to the pectoralis major muscle.  This dissected away from the breast tissue to include the clavipectoral fascia of the pectoralis fascia.  This was done at the clavicle and then lateral to the latissimus muscle and the medial to the sternum.  We then dissected the breast tissue away from the skin anteriorly.  The nipple was taken down with sharp dissection and a biopsy of the right nipple was benign.  We then removed the remainder the breast tissue is quite dense.  Gross margins are negative.  Both skin flaps are viable this point time there is no redundant or excessive breast tissue left behind.  This was  irrigated made hemostatic with cautery.  This portion the case Dr. Iran Planas scrubbed in in completed the reconstruction part.  Please see her op note for details.  EBL was 250 cc and the patient was stable.  All counts are correct this portion of the case.

## 2019-01-19 NOTE — H&P (Signed)
Subjective:     Patient ID: Kelli [name] is a 48 y.o. female.  HPI  Patient of Drs. Lindi Adie and Cornett referred for consultation breast reconstruction. Presented following screening MMG with RIGHT breast mass. MMG/US showed 1.4 cm mass right UOQ 11 o'clock position. Axilla benign. Biopsy showed invasive mammary carcinoma, mixed phenotype, Her2 -, ER/PR+   MRI demonstrated NME spanning 5.3 x 4.3 x 3.6 cm of right central upper breast with a discrete mass 1.8 x 1.1 x 1.6 cm and 2 adjacent satellite lesions, in total measuring 4.0 x 3.0 cm. Scattered 6-7 mm nodules within the lower RIGHT breast noted. In the LEFT breast UOQ 0.6 x 0.6 x 0.6 cm mass noted. Additional biopsies labeled RIGHT breast 2:30 with invasive mammary ca, ER/PR+, Her2-. LEFT breast UOQ LCIS intermediate grade.  Completed neoadjuvant chemotherapy with tamoxifen. Repeat MMG/US 11/2018 showed increase in size of the right breast mass at 11 o'clock position from 1.4x1.1x0.8cm to 1.4x1.4x1.2cm, decrease in size of the right breast mass at 2:30 o'clock position from1.9 x 1.2 x 1.4 cmto1.8 x 0.9 x 1.1 cm, and multiple additional areas of distortion in the right breast. Additional biopsies labeled right breast 3 o clock and 1130 with invasive mammary carcinoma.  Mastectomy right recommended, possible bilateral given left LCIS in setting extremely dense breasts. Plan bilateral mastectomies.  Oncotype 10. Patient declined genetic testing.   Current 34 C happy with this.  Wt stable.  Lives with husband two kids 87 and 35. She is SAHM.     Objective:   Physical Exam  Cardiovascular: Normal rate, regular rhythm and normal heart sounds.  Pulmonary/Chest: Effort normal and breath sounds normal.  Abdominal:  Small to moderate volume soft tissue  Lymphadenopathy:    She has no axillary adenopathy.  Skin:  Fitzpatrick 2   Grade 1 ptosis right grade 2 left SN to nipple R 24 L 25 cm BW R 15 L 15 cm (CW 12 cm) Nipple to  IMF R 7.5 L 8 cm    Assessment:     Right breast ca UOQ ER+, LCIS left breast Neoadjuvant tamoxifen    Plan:     Planbilateral mastectomieswith immediate expander acellular dermis reconstruction.Reviewed incisions, drains, OR length, hospital stay and recovery, limitations. Discussed process of expansion and implant based risks including rupture, MRI surveillance for silicone implants, infection requiring surgery or removal, contracture. Reviewed risks mastectomy flap necrosis requiring additional surgery.   Reviewed reconstruction will be asensate and not stimulate. Reviewed risks mastectomy flap necrosis requiring additional surgery. Aesthetically she is candidate for NSM, oncologically Dr. Brantley Stage ok with NSM.  Discussed use of acellular dermis in reconstruction, cadaveric source, incorporation over several weeks, risk that if has seroma or infection can act as additional nidus for infection if not incorporated.  Discussed prepectoral vs sub pectoral reconstruction. Discussed with patient and benefit of this is no animation deformity, may be less pain. Risk may be more visible rippling over upper poles, greater need of ADM. Reviewed pre pectoral would require larger amount acellular dermis, more drains. Discussed any type reconstruction also risks long term displacement implant and visible rippling. If prepectoral counseled I would recommend she be comfortable with silicone implants as more options that have less rippling. She agrees to prepectoral placement.  Reviewed reconstruction will be asensate and not stimulate. Reviewed additional risks including but not limited to risks mastectomy flap necrosis requiring additional surgery, seroma, hematoma, asymmetry, need to additional procedures, fat necrosis, DVT/PE, damage to adjacent structures, cardiopulmonary complications.  Discussed risk COVID infectionthrough this elective surgery. It is likely patient will receive COVID testing  prior to surgery. Discussed even if patient receivesa negative test result, the tests in some cases may fail to detect the virus or patient maycontract COVID after the test.COVID 19 infectionbefore/during/aftersurgery may result in lead to a higher chance of complication and death.  Irene Limbo, MD [facility] [phone], pin [phone]

## 2019-01-19 NOTE — Anesthesia Preprocedure Evaluation (Addendum)
Anesthesia Evaluation  Patient identified by MRN, date of birth, ID band Patient awake    Reviewed: Allergy & Precautions, NPO status , Patient's Chart, lab work & pertinent test results  Airway Mallampati: II  TM Distance: >3 FB Neck ROM: Full    Dental no notable dental hx. (+) Teeth Intact, Dental Advisory Given   Pulmonary neg pulmonary ROS,    Pulmonary exam normal breath sounds clear to auscultation       Cardiovascular negative cardio ROS Normal cardiovascular exam Rhythm:Regular Rate:Normal     Neuro/Psych  Headaches, negative psych ROS   GI/Hepatic negative GI ROS, Neg liver ROS,   Endo/Other  negative endocrine ROS  Renal/GU negative Renal ROS  negative genitourinary   Musculoskeletal negative musculoskeletal ROS (+)   Abdominal   Peds  Hematology negative hematology ROS (+)   Anesthesia Other Findings   Reproductive/Obstetrics                            Anesthesia Physical Anesthesia Plan  ASA: II  Anesthesia Plan: General and Regional   Post-op Pain Management:  Regional for Post-op pain   Induction: Intravenous  PONV Risk Score and Plan: 3  Airway Management Planned: Oral ETT  Additional Equipment:   Intra-op Plan:   Post-operative Plan: Extubation in OR  Informed Consent: I have reviewed the patients History and Physical, chart, labs and discussed the procedure including the risks, benefits and alternatives for the proposed anesthesia with the patient or authorized representative who has indicated his/her understanding and acceptance.     Dental advisory given  Plan Discussed with: CRNA  Anesthesia Plan Comments:         Anesthesia Quick Evaluation

## 2019-01-19 NOTE — H&P (Signed)
Kelli [name]  Location: [facility] Patient #: [id number] DOB: 24-Jan-1971 Married / Language: English / Race: White Female  History of Present Illness  Patient words: Patient returns for follow-up of her multifocal right breast cancer. She also has an area of LCIS left side. She is finished neoadjuvant hormonal therapy but has had minimal change in size on repeat ultrasound. She has seen plastic surgery about reconstruction. She would like to proceed with bilateral nipple sparing mastectomy with implant reconstruction after discussion today. We talked about 30 minutes following her options which included free flaps, rotational flaps and no reconstruction whatsoever. I did not feel she'll be a good breast conserving candidate after discussion and review of her studies and she agreed with that.       ADDITIONAL INFORMATION: 1. PROGNOSTIC INDICATORS Results: IMMUNOHISTOCHEMICAL AND MORPHOMETRIC ANALYSIS PERFORMED MANUALLY The tumor cells are NEGATIVE for Her2 (0). Estrogen Receptor: 80%, POSITIVE, STRONG STAINING INTENSITY Progesterone Receptor: 80%, POSITIVE, STRONG STAINING INTENSITY REFERENCE RANGE ESTROGEN RECEPTOR NEGATIVE 0% POSITIVE =>1% REFERENCE RANGE PROGESTERONE RECEPTOR NEGATIVE 0% POSITIVE =>1% All controls stained appropriately Enid Cutter MD Pathologist, Electronic Signature ( Signed 12/01/2018) FINAL DIAGNOSIS Diagnosis 1. Breast, right, needle core biopsy, medial 3 o'clock region, middle third (ribbon clip) - INVASIVE MAMMARY CARCINOMA, SEE COMMENT. - MAMMARY CARCINOMA IN SITU. 2. Breast, right, needle core biopsy, far posterior 11 o'clock region (x clip) - INVASIVE MAMMARY CARCINOMA. - MAMMARY CARCINOMA IN SITU. 1 of 3 FINAL for [name], [name] ([id number]) Microscopic Comment 1. and 2. The carcinoma appears grade 2. The greatest linear extent of invasive carcinoma is 3 mm (part #1). The carcinoma in parts #1 and #2 appear  similar. E-cadherin can be performed upon request. Prognostic markers will be ordered on part #1. Dr. Lyndon Code has reviewed the case. The case was called to [facility] on 11/29/18. Vicente Males MD Pathologist, Electronic Signature (Case signed 11/29/2018) Specimen Gross and Clinical Information Specimen Comment 1. Cores in formalin at 8:04; diagnosed Dec. 2019/Jan. 2020 with [facility] in 2 separate quadrants right breast; has received some neoadjuvant therapy; more biopsies requested due to desire for breast conservation 2. Cores in formalin at 8:20 Specimen(s) Obtained: 1. Breast, right, needle core biopsy, medial 3 o'clock region, middle third (ribbon clip) 2. Breast, right, needle core biopsy, far posterior 11 o'clock region (x clip) Specimen Clinical Information 1. [facility] 2. [facility] Gross 1. The specimen         48 year old female presenting for follow-up following diagnosis invasive cancer at 2 sites in the right breast (11 o'clock and 230). She has been placed on neoadjuvant tamoxifen.She desires breast conserving surgery if possible.  EXAM: DIGITAL DIAGNOSTIC RIGHT MAMMOGRAM WITH CAD AND TOMO  ULTRASOUND RIGHT BREAST  COMPARISON: Previous exam(s).  ACR Breast Density Category c: The breast tissue is heterogeneously dense, which may obscure small masses.  FINDINGS: The ribbon shaped biopsy marking clip in the upper-outer quadrant of the right breast (11 o'clock biopsy) is again identified within a distorted mass. The appearance of the distortion is more prominent than on the initial screening mammogram from December of 2019. The coil shaped biopsy marking clip is seen in the medial right breast in an area of subtle distortion. There are multiple other areas of distortion in the upper inner and upper outer quadrant of the left breast. One is located directly posterior to the biopsied mass in the right breast at 11 o'clock. At least 3  others are seen medial to the  biopsy marking clip at 2:30. These areas of distortion correspond with the region of abnormal enhancement seen in the upper outer and upper inner right breast on the patient's MRI.  Mammographic images were processed with CAD.  Ultrasound of the biopsy-proven site of malignancy right breast at 11 o'clock, 4 cm from the nipple again demonstrates an ill-defined hypoechoic vascular mass measuring 1.4 x 1.4 x 1.2 cm, previously 1.4 x 1.1 x 0.8 cm.  The biopsy proven malignancy in the right breast at 2:30, 3 cm from the nipple demonstrates an irregular distorted hypoechoic mass measuring 1.8 x 0.9 x 1.1 cm, previously 1.9 x 1.2 x 1.4 cm.  An additional mass was seen in the right breast at 1 o'clock, 2 cm from the nipple which is taller than wide, irregular and vascular measuring 1.3 x 0.8 x 1.0 cm. This likely corresponds with 1 of the areas of distortion seen mammographically.  IMPRESSION: 1. The biopsy proven area of malignancy in the right breast at 11 o'clock is stable to slightly larger than on the ultrasound from December 2019.  2. The biopsy proven area of malignancy in the right breast at 2:30 is stable to slightly smaller than on the ultrasound from January of 2020.  3. There are multiple other areas of distortion in the upper inner and upper outer quadrants of the right breast spanning the tissue between the 2 biopsy marking clips. One of these is seen sonographically at 1 o'clock, while the others have no definite sonographic correlate. These are highly concerning for additional sites of disease.  RECOMMENDATION: If the patient desires breast conservation, I would recommend biopsy of additional sites in the right breast. Stereotactic or ultrasound guided biopsy of one or more areas of distortion in the upper inner and upper outer right breast could be performed, and/or MRI guided biopsy of the indeterminate nodular enhancement in  the lower right breast seen on MRI, if that persists following tamoxifen therapy.  I have discussed the findings and recommendations with the patient. Results were also provided in writing at the conclusion of the visit. If applicable, a reminder letter will be sent to the patient regarding the next appointment.  BI-RADS CATEGORY 6: Known biopsy-proven malignancy.   Electronically Signed By: Ammie Ferrier M.D. On: 11/21/2018 12:17.  The patient is a 48 year old female.   Allergies  Sulfa Drugs Rash. Allergies Reconciled  Medication History  Tamoxifen Citrate (10MG Tablet, Oral) Active. Medications Reconciled    Vitals  12/12/2018 11:04 AM Weight: 145 lb Height: 66in Body Surface Area: 1.74 m Body Mass Index: 23.4 kg/m  Temp.: 97.58F  Pulse: 73 (Regular)  BP: 126/72 (Sitting, Left Arm, Standard)      Physical Exam  General Mental Status-Alert. General Appearance-Consistent with stated age. Hydration-Well hydrated. Voice-Normal.  Head and Neck Head-normocephalic, atraumatic with no lesions or palpable masses.  Chest and Lung Exam Chest and lung exam reveals -quiet, even and easy respiratory effort with no use of accessory muscles and on auscultation, normal breath sounds, no adventitious sounds and normal vocal resonance. Inspection Chest Wall - Normal. Back - normal.  Cardiovascular Cardiovascular examination reveals -normal heart sounds, regular rate and rhythm with no murmurs and normal pedal pulses bilaterally.  Neurologic Neurologic evaluation reveals -alert and oriented x 3 with no impairment of recent or remote memory. Mental Status-Normal.  Lymphatic Head & Neck  General Head & Neck Lymphatics: Bilateral - Description - Normal. Axillary  General Axillary Region: Bilateral - Description - Normal. Tenderness -  Non Tender.    Assessment & Plan   BREAST CANCER, RIGHT  (C50.911) Impression: Lengthy discussion about options due to her multifocal disease. Given her breast size multiple lumpectomies would not be possible at this point time. She is opted for bilateral nipple sparing mastectomy with reconstruction. She understands the need for implants with this type surgery. I discussed pros and cons of this as well as long-term expectations and the need for possible revisional surgery down the road. I discussed free flaps and rotational flaps the pros and cons of these as well with her. I discussed sentinel lymph node mapping and the rationale for doing the left mastectomy as well due to the LCIS and given her young age she would be at risk of a new breast cancer in the uninvolved side. The risk of 0.5 % PER YEAR   Discussed treatment options for breast cancer to include breast conservation vs mastectomy with reconstruction. Pt has decided on mastectomy. Risk include bleeding, infection, flap necrosis, pain, numbness, recurrence, hematoma, other surgery needs. Pt understands and agrees to proceed. Risk of sentinel lymph node mapping include bleeding, infection, lymphedema, shoulder pain. stiffness, dye allergy. cosmetic deformity , blood clots, death, need for more surgery. Pt agres to proceed.  Current Plans You are being scheduled for surgery- Our schedulers will call you.  You should hear from our office's scheduling department within 5 working days about the location, date, and time of surgery. We try to make accommodations for patient's preferences in scheduling surgery, but sometimes the OR schedule or the surgeon's schedule prevents [place] from making those accommodations.  If you have not heard from our office [phone]) in 5 working days, call the office and ask for your surgeon's nurse.  If you have other questions about your diagnosis, plan, or surgery, call the office and ask for your surgeon's nurse.  Pt Education - [facility] Mastectomy HCI Pt  Education - ABC (After Breast Cancer) Class Info: discussed with patient and provided information.  BREAST NEOPLASM, TIS (LCIS), LEFT (D05.02)

## 2019-01-19 NOTE — Anesthesia Procedure Notes (Signed)
Anesthesia Regional Block: Pectoralis block   Pre-Anesthetic Checklist: ,, timeout performed, Correct Patient, Correct Site, Correct Laterality, Correct Procedure, Correct Position, site marked, Risks and benefits discussed,  Surgical consent,  Pre-op evaluation,  At surgeon's request and post-op pain management  Laterality: Right  Prep: Maximum Sterile Barrier Precautions used, chloraprep       Needles:  Injection technique: Single-shot  Needle Type: Echogenic Stimulator Needle     Needle Length: 9cm  Needle Gauge: 22     Additional Needles:   Procedures:,,,, ultrasound used (permanent image in chart),,,,  Narrative:  Start time: 01/19/2019 10:40 AM End time: 01/19/2019 10:50 AM Injection made incrementally with aspirations every 5 mL.  Performed by: Personally  Anesthesiologist: Freddrick March, MD  Additional Notes: Monitors applied. No increased pain on injection. No increased resistance to injection. Injection made in 5cc increments. Good needle visualization. Patient tolerated procedure well.

## 2019-01-19 NOTE — Anesthesia Procedure Notes (Signed)
Anesthesia Regional Block: Pectoralis block   Pre-Anesthetic Checklist: ,, timeout performed, Correct Patient, Correct Site, Correct Laterality, Correct Procedure, Correct Position, site marked, Risks and benefits discussed,  Surgical consent,  Pre-op evaluation,  At surgeon's request and post-op pain management  Laterality: Left  Prep: Maximum Sterile Barrier Precautions used, chloraprep       Needles:  Injection technique: Single-shot  Needle Type: Echogenic Stimulator Needle     Needle Length: 9cm  Needle Gauge: 22     Additional Needles:   Procedures:,,,, ultrasound used (permanent image in chart),,,,  Narrative:  Start time: 01/19/2019 10:51 AM End time: 01/19/2019 11:00 AM Injection made incrementally with aspirations every 5 mL.  Performed by: Personally  Anesthesiologist: Freddrick March, MD  Additional Notes: Monitors applied. No increased pain on injection. No increased resistance to injection. Injection made in 5cc increments. Good needle visualization. Patient tolerated procedure well.

## 2019-01-19 NOTE — Transfer of Care (Signed)
Immediate Anesthesia Transfer of Care Note  Patient: Kelli [name]  Procedure(s) Performed: BILATERAL NIPPLE SPARING MASTECTOMY WITH RIGHT SENTINEL LYMPH NODE MAPPING (Bilateral Breast) BILATERAL BREAST RECONSTRUCTION WITH PLACEMENT OF TISSUE EXPANDERS AND ALLODERM (Bilateral Chest)  Patient Location: PACU  Anesthesia Type:GA combined with regional for post-op pain  Level of Consciousness: awake, alert  and oriented  Airway & Oxygen Therapy: Patient Spontanous Breathing and Patient connected to nasal cannula oxygen  Post-op Assessment: Report given to RN and Post -op Vital signs reviewed and stable  Post vital signs: Reviewed and stable  Last Vitals:  Vitals Value Taken Time  BP    Temp    Pulse    Resp    SpO2      Last Pain:  Vitals:   01/19/19 1003  TempSrc: Oral  PainSc: 0-No pain         Complications: No apparent anesthesia complications

## 2019-01-19 NOTE — Op Note (Signed)
Operative Note   DATE OF OPERATION: 6.18.20  LOCATION: [facility]-observation  SURGICAL DIVISION: Plastic Surgery  PREOPERATIVE DIAGNOSES:  1. Right breast cancer UOQ ER+  POSTOPERATIVE DIAGNOSES:  same  PROCEDURE:  1. Bilateral breast reconstruction with tissue expanders 2. Acellular dermis (Alloderm) for soft tissue reiforcement 600 cm2  SURGEON: Irene Limbo MD MBA  ASSISTANT: none  ANESTHESIA:  General.   EBL: 300 ml for entire procedure  COMPLICATIONS: None immediate.   INDICATIONS FOR PROCEDURE:  The patient, Kelli [name], is a 48 y.o. female born on 30-May-1971, is here for bilateral immediate prepectoral expander acellular dermis reconstruction following bilateral nipple sparing mastectomies.   FINDINGS: Natrelle 133S-FV-12-T 400 ml tissue expanders placed bilateral, initial fill volume 320 ml air bilateral. RIGHT SN [id number] LEFT SN [id number]  DESCRIPTION OF PROCEDURE:  The patient was marked with the patient in the preoperative area to mark sternal notch, chest midline, anterior axillary lines and inframammary folds. The patient's operative site was prepped and draped in a sterile fashion. A time out was performed and all information was confirmed to be correct.Following completion of mastectomies, reconstruction began onleftside.  The cavity was irrigated with solution containing Ancef, gentamicin, and bacitracin. Hemostasis was ensured.0 V lock suture used to secure pectoralis muscle to chest wall.A 19 Fr drain was placed in subcutaneous position laterally and a 15 Fr drain placed along inframammary fold. Each secured to skin with 2-0 nylon. Cavity irrigated with Betadine. The tissue expanders were prepared on back table prior in insertion. The expander was filled with air to373ml.Perforated acellular dermis wasdraped over anterior surface expander. The ADM was then secured to itself over posterior surface of expanderwith 4-0 chromic. Redundant folds  acellular dermis excised so that the ADM layflat without folds over air filled expander.The expander was secured tofascia over lateral sternal borderwith a 0 vicryl. Thelateral tab wasalso secured to pectoralis muscle with 0-vicryl. The ADM was secured to pectoralis muscle and chest wall along inferior border at inframammary foldwith 0 V-lock suture.Laterally the mastectomy flap over posterior axillary line was advanced anteriorly and the subcutaneous tissue and superficial fascia was secured to pectoralis muscle and acellular dermis with 0-vicryl. Skin closure completedwith 3-0 vicryl in fascial layer and 4-0 vicryl in dermis. Skin closure completed with 4-0 monocryl subcuticular and tissue adhesive.  I then directed my attention torightchest where similar irrigation and drain placement completed.0 V lock suture used to secure pectoralis muscle to chest wall.The prepared expander with ADM secured over anterior surface was placed in right chest and tabs secured to chest wall and pectoralis muscle with 0- vicryl suture. The acellular dermis at inframammary fold was secured to chest wall with 0 V-lock suture.Laterally the mastectomy flap over posterior axillary line was advanced anteriorly and the subcutaneous tissue and superficial fascia was secured to pectoralis muscle and acellular dermis with 0-vicryl. Skin closure completedwith 3-0 vicryl in fascial layer and 4-0 vicryl in dermis. Skin closure completed with 4-0 monocryl subcuticular and tissue adhesive.The mastectomy flaps were redraped so that NAC was symmetric from midline and sternal notch. Tegaderm dressings applied followed bydry dressing,breast binder.  The patient was allowed to wake from anesthesia, extubated and taken to the recovery room in satisfactory condition.   SPECIMENS: none  DRAINS: 15 and 19 Fr JP in right and left breast reconstruction  Irene Limbo, MD [facility] Plastic [facility] Reconstruct[facility] Surgery [phone], pin  [phone]

## 2019-01-20 ENCOUNTER — Encounter (HOSPITAL_BASED_OUTPATIENT_CLINIC_OR_DEPARTMENT_OTHER): Payer: Self-pay | Admitting: Surgery

## 2019-01-20 DIAGNOSIS — C50411 Malignant neoplasm of upper-outer quadrant of right female breast: Secondary | ICD-10-CM | POA: Diagnosis not present

## 2019-01-20 DIAGNOSIS — Z882 Allergy status to sulfonamides status: Secondary | ICD-10-CM | POA: Diagnosis not present

## 2019-01-20 DIAGNOSIS — Z170 Estrogen receptor positive status [ER+]: Secondary | ICD-10-CM | POA: Diagnosis not present

## 2019-01-20 DIAGNOSIS — D0502 Lobular carcinoma in situ of left breast: Secondary | ICD-10-CM | POA: Diagnosis not present

## 2019-01-20 DIAGNOSIS — Z9221 Personal history of antineoplastic chemotherapy: Secondary | ICD-10-CM | POA: Diagnosis not present

## 2019-01-20 DIAGNOSIS — Z01812 Encounter for preprocedural laboratory examination: Secondary | ICD-10-CM | POA: Diagnosis not present

## 2019-01-20 NOTE — Discharge Summary (Signed)
Physician Discharge Summary  Patient ID: Kelli [name] MRN: [id number] DOB/AGE: 1970/11/11 48 y.o.  Admit date: 01/19/2019 Discharge date: 01/20/2019  Admission Diagnoses: Right breast cancer  Discharge Diagnoses:  Active Problems:   Breast cancer, right [facility])   Discharged Condition: stable  Hospital Course: Post operatively patient did well tolerating diet, good pain control and ambulating with minimal assist. Instructed on compression and drain care.  Treatments: surgery: bilateral nipple sparing mastectomies with tissue expander acellular dermis reconstruction right sentinel node 6.18.20  Discharge Exam: Blood pressure (!) 90/50, pulse 71, temperature 99.1 F (37.3 C), resp. rate 20, height 5\' 6"  (1.676 m), weight 64 kg, SpO2 97 %. Incision/Wound: chest soft incisions dry intact drains watery serosanguinous flaps viable Tegaderms in place  Disposition: Discharge disposition: 01-Home or Self Care       Discharge Instructions    Call MD for:  redness, tenderness, or signs of infection (pain, swelling, bleeding, redness, odor or green/yellow discharge around incision site)   Complete by: As directed    Call MD for:  temperature >100.5   Complete by: As directed    Discharge instructions   Complete by: As directed    Ok to remove dressings and shower am 6.20.20. Soap and water ok, pat Tegaderms dry. Do not remove Tegaderms. No creams or ointments over incisions. Do not let drains dangle in shower, attach to lanyard or similar.Strip and record drains twice daily and bring log to clinic visit.  Breast binder or soft compression bra, all other times.  Ok to raise arms above shoulders for bathing and dressing.  No house yard work or exercise until cleared by MD.   Recommend ibuprofen with meals as directed to aid with pain control. Ok to also use Tylenol for pain. Recommend Miralax or Dulcolax as needed for constipation.   Driving Restrictions   Complete by: As directed     No driving for 2 weeks then no driving if taking narcotics   Lifting restrictions   Complete by: As directed    No lifting > 5 lbs until cleared by MD   Resume previous diet   Complete by: As directed       Follow-up Information    Irene Limbo, MD In 1 week.   Specialty: Plastic Surgery Why: as scheduled Contact information: [address] [place] [address] [phone]        Erroll Luna, MD. Schedule an appointment as soon as possible for a visit in 2 weeks.   Specialty: General Surgery Contact information: [address] [place] [address] [phone]           Signed: Irene Limbo 01/20/2019, 7:02 AM

## 2019-01-20 NOTE — Discharge Instructions (Signed)

## 2019-01-23 ENCOUNTER — Encounter: Payer: Self-pay | Admitting: *Deleted

## 2019-01-24 NOTE — Assessment & Plan Note (Signed)
Screening detected right breast mass right breast biopsy 11 o'clock position: Invasive mammary cancer mixed histology, ER 95%, PR 95%, Ki-67 10%, HER-2 -1+ by IHC, Stage Ia Oncotype DX recurrence score: 10, risk of distant recurrence 3%, low risk Breast MRI 08/15/2018: Large area of non-mass enhancement 5.3 x 4.3 x 3.6 cm, medial aspect and another mass 1.8 cm and 2 adjacent satellite lesions total measuring 4 x 3 cm.  Scattered 6 to 7 mm nodules lower portion right breast.  Left breast 0.6 cm enhancing mass (biopsy intermediate grade LCIS)  Treatment plan: 1.  Neoadjuvant antiestrogen therapy with tamoxifen started 08/19/2018 x 6 months 2.  Bilateral mastectomies 01/19/2019 3.  Followed by adjuvant antiestrogen therapy -------------------------------------------------------------------------------------------------------------  01/19/2019:Bilateral mastectomies with reconstruction: Right mastectomy: Residual invasive carcinoma, multifocal, 1.6 cm, 0/3 lymph nodes, margins negative, ER 95%, PR 95%, HER-2 -1+, Ki-67 10%, residual cancer burden 1.63, left mastectomy: LCIS  Pathology counseling: I discussed the final pathology report of the patient provided  a copy of this report. I discussed the margins as well as lymph node surgeries. We also discussed the final staging along with previously performed ER/PR and HER-2/neu testing.  Recommendation: Antiestrogen therapy with tamoxifen for 10 years Patient has a prescription will continue the medication.  Return to clinic in 3 months for survivorship care plan visit with Mendel Ryder

## 2019-01-25 ENCOUNTER — Telehealth: Payer: Self-pay | Admitting: Hematology and Oncology

## 2019-01-25 ENCOUNTER — Encounter: Payer: Self-pay | Admitting: *Deleted

## 2019-01-25 NOTE — Telephone Encounter (Signed)
I talk with patient regarding video visit °

## 2019-01-27 NOTE — Progress Notes (Signed)
HEMATOLOGY-ONCOLOGY MYCHART VIDEO VISIT PROGRESS NOTE  I connected with Kelli [name] on 01/30/2019 at 10:00 AM EDT by MyChart video conference and verified that I am speaking with the correct person using two identifiers.  I discussed the limitations, risks, security and privacy concerns of performing an evaluation and management service by MyChart and the availability of in person appointments.  I also discussed with the patient that there may be a patient responsible charge related to this service. The patient expressed understanding and agreed to proceed.  Patient's Location: Home Physician Location: Clinic  CHIEF COMPLIANT: Follow-up s/p bilateral mastectomies to review pathology report Dr. Malva Cogan here  INTERVAL HISTORY: Kelli [name] is a 48 y.o. female with above-mentioned history of right breast cancer treated with neoadjuvant tamoxifen therapy followed by bilateral mastectomies with reconstruction on 01/19/19 with Dr. Brantley Stage and Dr. Iran Planas. Pathology confirmed residual carcinoma 1.6cm, ER/PR 95%, HER-2 negative, Ki67 10% in the right breast and LCIS in the left breast. She presents over MyChart today for follow-up to review the pathology report.   Oncology History  Malignant neoplasm of upper-outer quadrant of right breast in female, estrogen receptor positive ([place])  07/22/2018 Initial Diagnosis   Screening detected right breast mass right breast biopsy 11 o'clock position: Invasive mammary cancer mixed histology, ER 95%, PR 95%, Ki-67 10%, HER-2 -1+ by IHC,   08/14/2018 Breast MRI   Right breast upper central portion non-mass enhancement 5.3 cm, along the medial aspect discrete mass 1.8 cm, 2 adjacent satellite nodules together make 4 cm.  Scattered 6 to 7 mm enhancing nodules lower portion right breast, left breast 0.6 cm enhancing mass    08/19/2018 Cancer Staging   Staging form: Breast, AJCC 8th Edition - Clinical: Stage IA (cT1c, cN0, cM0, G2, ER+, PR+, HER2-) - Signed  by Eppie Gibson, MD on 08/19/2018   01/19/2019 Surgery   Bilateral mastectomies with reconstruction ([place]): Right mastectomy: Residual invasive carcinoma, multifocal, 1.6 cm, 0/3 lymph nodes, margins negative, ER 95%, PR 95%, HER-2 -1+, Ki-67 10%, residual cancer burden 1.63, left mastectomy: LCIS     REVIEW OF SYSTEMS:   Constitutional: Denies fevers, chills or abnormal weight loss Eyes: Denies blurriness of vision Ears, nose, mouth, throat, and face: Denies mucositis or sore throat Respiratory: Denies cough, dyspnea or wheezes Cardiovascular: Denies palpitation, chest discomfort Gastrointestinal:  Denies nausea, heartburn or change in bowel habits Skin: Denies abnormal skin rashes Lymphatics: Denies new lymphadenopathy or easy bruising Neurological:Denies numbness, tingling or new weaknesses Behavioral/Psych: Mood is stable, no new changes  Extremities: No lower extremity edema Breast: Bilateral mastectomies with reconstruction All other systems were reviewed with the patient and are negative.  Observations/Objective:  There were no vitals filed for this visit. There is no height or weight on file to calculate BMI.  I have reviewed the data as listed CMP Latest Ref Rng & Units 01/17/2019 05/17/2018  Glucose 70 - 99 mg/dL 117(H) 127(H)  BUN 6 - 20 mg/dL 12 14  Creatinine 0.44 - 1.00 mg/dL 0.83 0.62  Sodium 135 - 145 mmol/L 139 140  Potassium 3.5 - 5.1 mmol/L 4.1 3.6  Chloride 98 - 111 mmol/L 107 103  CO2 22 - 32 mmol/L 24 30  Calcium 8.9 - 10.3 mg/dL 9.3 9.5  Total Protein 6.5 - 8.1 g/dL 6.9 7.1  Total Bilirubin 0.3 - 1.2 mg/dL 0.9 1.0  Alkaline Phos 38 - 126 U/L 41 58  AST 15 - 41 U/L 20 12  ALT 0 - 44 U/L  19 11    Lab Results  Component Value Date   WBC 6.1 01/17/2019   HGB 12.6 01/17/2019   HCT 38.1 01/17/2019   MCV 94.1 01/17/2019   PLT 236 01/17/2019   NEUTROABS 3.8 01/17/2019      Assessment Plan:  Malignant neoplasm of upper-outer quadrant  of right breast in female, estrogen receptor positive ([place]) Screening detected right breast mass right breast biopsy 11 o'clock position: Invasive mammary cancer mixed histology, ER 95%, PR 95%, Ki-67 10%, HER-2 -1+ by IHC, Stage Ia Oncotype DX recurrence score: 10, risk of distant recurrence 3%, low risk Breast MRI 08/15/2018: Large area of non-mass enhancement 5.3 x 4.3 x 3.6 cm, medial aspect and another mass 1.8 cm and 2 adjacent satellite lesions total measuring 4 x 3 cm.  Scattered 6 to 7 mm nodules lower portion right breast.  Left breast 0.6 cm enhancing mass (biopsy intermediate grade LCIS)  Treatment plan: 1.  Neoadjuvant antiestrogen therapy with tamoxifen started 08/19/2018 x 6 months 2.  Bilateral mastectomies 01/19/2019 3.  Followed by adjuvant antiestrogen therapy -------------------------------------------------------------------------------------------------------------  01/19/2019:Bilateral mastectomies with reconstruction: Right mastectomy: Residual invasive carcinoma, multifocal, 1.6 cm, 0/3 lymph nodes, margins negative, ER 95%, PR 95%, HER-2 -1+, Ki-67 10%, residual cancer burden 1.63, left mastectomy: LCIS  Pathology counseling: I discussed the final pathology report of the patient provided  a copy of this report. I discussed the margins as well as lymph node surgeries. We also discussed the final staging along with previously performed ER/PR and HER-2/neu testing.  Recommendation: Antiestrogen therapy with tamoxifen for 10 years Patient has a prescription will continue the medication. We will discuss her case in the tumor board but I do not believe she needs adjuvant radiation.  Return to clinic in 3 months for survivorship care plan visit with Mendel Ryder    I discussed the assessment and treatment plan with the patient. The patient was provided an opportunity to ask questions and all were answered. The patient agreed with the plan and demonstrated an understanding of the  instructions. The patient was advised to call back or seek an in-person evaluation if the symptoms worsen or if the condition fails to improve as anticipated.   I provided 15 minutes of face-to-face MyChart video visit time during this encounter.    Rulon Eisenmenger, MD 01/30/2019   I, Molly Dorshimer, am acting as scribe for Nicholas Lose, MD.  I have reviewed the above documentation for accuracy and completeness, and I agree with the above.

## 2019-01-30 ENCOUNTER — Inpatient Hospital Stay: Payer: BC Managed Care – PPO | Attending: Hematology and Oncology | Admitting: Hematology and Oncology

## 2019-01-30 DIAGNOSIS — Z170 Estrogen receptor positive status [ER+]: Secondary | ICD-10-CM | POA: Insufficient documentation

## 2019-01-30 DIAGNOSIS — C50411 Malignant neoplasm of upper-outer quadrant of right female breast: Secondary | ICD-10-CM | POA: Diagnosis not present

## 2019-01-31 ENCOUNTER — Encounter: Payer: Self-pay | Admitting: *Deleted

## 2019-02-02 ENCOUNTER — Telehealth: Payer: Self-pay | Admitting: Adult Health

## 2019-02-02 NOTE — Telephone Encounter (Signed)
Scheduled appt per 6/30 sch message - unable to reach pt . Mailed letter with appt date and time

## 2019-02-14 ENCOUNTER — Other Ambulatory Visit: Payer: Self-pay

## 2019-02-14 ENCOUNTER — Ambulatory Visit: Payer: BC Managed Care – PPO | Attending: Plastic Surgery | Admitting: Physical Therapy

## 2019-02-14 DIAGNOSIS — Z483 Aftercare following surgery for neoplasm: Secondary | ICD-10-CM

## 2019-02-14 DIAGNOSIS — M25612 Stiffness of left shoulder, not elsewhere classified: Secondary | ICD-10-CM | POA: Diagnosis not present

## 2019-02-14 DIAGNOSIS — M25611 Stiffness of right shoulder, not elsewhere classified: Secondary | ICD-10-CM

## 2019-02-14 DIAGNOSIS — M6281 Muscle weakness (generalized): Secondary | ICD-10-CM | POA: Diagnosis not present

## 2019-02-14 NOTE — Therapy (Signed)
[place], [place], [address] Phone: [phone]   Fax:  [phone]  Physical Therapy Evaluation  Patient Details  Name: Kelli [name] MRN: [id number] Date of Birth: Jun 13, 1971 Referring Provider (PT): Thimmappa    Encounter Date: 02/14/2019  PT End of Session - 02/14/19 1703    Visit Number  1    Number of Visits  9    Date for PT Re-Evaluation  03/17/19    PT Start Time  1440    PT Stop Time  1520    PT Time Calculation (min)  40 min    Activity Tolerance  Patient tolerated treatment well    Behavior During Therapy  [facility] for tasks assessed/performed       Past Medical History:  Diagnosis Date  . Family history of colon cancer   . Family history of prostate cancer   . IUD (intrauterine device) in place 05/17/2018   June 2015  . Migraines     Past Surgical History:  Procedure Laterality Date  . BREAST BIOPSY  2004  . BREAST RECONSTRUCTION WITH PLACEMENT OF TISSUE EXPANDER AND ALLODERM Bilateral 01/19/2019   Procedure: BILATERAL BREAST RECONSTRUCTION WITH PLACEMENT OF TISSUE EXPANDERS AND ALLODERM;  Surgeon: Irene Limbo, MD;  Location: [place];  Service: Plastics;  Laterality: Bilateral;  . NIPPLE SPARING MASTECTOMY WITH SENTINEL LYMPH NODE BIOPSY Bilateral 01/19/2019   Procedure: BILATERAL NIPPLE SPARING MASTECTOMY WITH RIGHT SENTINEL LYMPH NODE MAPPING;  Surgeon: Erroll Luna, MD;  Location: [place];  Service: General;  Laterality: Bilateral;    There were no vitals filed for this visit.   Subjective Assessment - 02/14/19 1446    Subjective  It feels kinda tight across my check    Pertinent History  Breast cancer( RT:ER/PR+, Her 2 -, LT: LCIS)  diagnosed in Dec. 2019 treated with tamoxifen and then bilateral mastecomy iwith 3 from right side and none from left side with immediate expander placement with 4 drains ( 2 out at one week and 2 at 2  weeks.  Will not need radiation    Currently in Pain?  No/denies         [facility] PT Assessment - 02/14/19 0001      Assessment   Medical Diagnosis  beast cancer     Referring Provider (PT)  Thimmappa     Onset Date/Surgical Date  01/19/19    Hand Dominance  Right      Precautions   Precautions  --   at risk for lymphedema      Restrictions   Weight Bearing Restrictions  No      Balance Screen   Has the patient fallen in the past 6 months  No    Has the patient had a decrease in activity level because of a fear of falling?   No    Is the patient reluctant to leave their home because of a fear of falling?   No      Home Environment   Living Environment  Private residence    Living Arrangements  Spouse/significant other   9 and 11    Available Help at Discharge  Family      Prior Function   Level of [place]  Unemployed    Leisure  used to run, boot camp, likes to exercise       Cognition   Overall Cognitive Status  Within Functional Limits for tasks  assessed      Observation/Other Assessments   Observations  pt with healing incisions on both breasts from nipple spaing mastectomies. Black eschar on left nipple with escar on incision on left lateral breast     Other Surveys   Quick Dash    Quick DASH   36.36      Sensation   Light Touch  Not tested      Coordination   Gross Motor Movements are Fluid and Coordinated  No   slow bilateral shoulder flexion due to tightness     Posture/Postural Control   Posture/Postural Control  No significant limitations      ROM / Strength   AROM / PROM / Strength  AROM;Strength      AROM   Overall AROM   Deficits    Right/Left Shoulder  Right;Left    Right Shoulder Flexion  140 Degrees    Right Shoulder ABduction  160 Degrees    Right Shoulder External Rotation  90 Degrees    Left Shoulder Flexion  155 Degrees    Left Shoulder ABduction  170 Degrees    Left Shoulder External Rotation  90 Degrees       Strength   Overall Strength  Deficits    Overall Strength Comments  limited by pain and tightness in both shoulders.  decreased scapular stabalization noted on right scapula     Strength Assessment Site  Shoulder    Right/Left Shoulder  Right;Left    Right Shoulder Flexion  3-/5    Right Shoulder ABduction  3-/5    Left Shoulder Flexion  3-/5    Left Shoulder ABduction  3-/5        LYMPHEDEMA/ONCOLOGY QUESTIONNAIRE - 02/14/19 1649      Type   Cancer Type  breast cancer       Surgeries   Mastectomy Date  01/19/19    Sentinel Lymph Node Biopsy Date  01/19/19    Number Lymph Nodes Removed  3      Treatment   Active Chemotherapy Treatment  --    Current Hormone Treatment  Yes    Drug Name  Tamoxifen      What other symptoms do you have   Are you Having Heaviness or Tightness  Yes    Are you having Pain  Yes    Are you having pitting edema  No    Is it Hard or Difficult finding clothes that fit  No    Do you have infections  No    Is there Decreased scar mobility  Yes    Stemmer Sign  No      Right Upper Extremity Lymphedema   10 cm Proximal to Olecranon Process  28.5 cm    Olecranon Process  14.5 cm    15 cm Proximal to Ulnar Styloid Process  24 cm    Just Proximal to Ulnar Styloid Process  14.5 cm    Across Hand at [employer]  19 cm    At [place] of 2nd Digit  5.8 cm      Left Upper Extremity Lymphedema   10 cm Proximal to Olecranon Process  28 cm    Olecranon Process  24.3 cm    15 cm Proximal to Ulnar Styloid Process  23.5 cm    Just Proximal to Ulnar Styloid Process  14 cm    Across Hand at [employer]  18.5 cm    At [place] of 2nd Digit  5.5  cm          Katina Dung - 02/14/19 0001    Open a tight or new jar  Moderate difficulty    Do heavy household chores (wash walls, wash floors)  Moderate difficulty    Carry a shopping bag or briefcase  Mild difficulty    Wash your back  No difficulty    Use a knife to cut food  No difficulty     Recreational activities in which you take some force or impact through your arm, shoulder, or hand (golf, hammering, tennis)  Unable    During the past week, to what extent has your arm, shoulder or hand problem interfered with your normal social activities with family, friends, neighbors, or groups?  Modererately    During the past week, to what extent has your arm, shoulder or hand problem limited your work or other regular daily activities  Slightly    Arm, shoulder, or hand pain.  Mild    Tingling (pins and needles) in your arm, shoulder, or hand  Moderate    Difficulty Sleeping  Mild difficulty    DASH Score  36.36 %        Objective measurements completed on examination: See above findings.      [place] Adult PT Treatment/Exercise - 02/14/19 0001      Self-Care   Self-Care  Other Self-Care Comments    Other Self-Care Comments   begain instruction in lymphedema risk reduction precautions and issued hanout from ABC class       Exercises   Exercises  Other Exercises    Other Exercises   supine dowel, diaphragmatic breathing and lower trunk rotation all within pain tolerance range               PT Education - 02/14/19 1703    Education Details  beginning home exercise, lymphedema risk reduction    Person(s) Educated  Patient    Methods  Explanation;Demonstration;Handout    Comprehension  Need further instruction          PT Long Term Goals - 02/14/19 1709      PT LONG TERM GOAL #1   Title  pt wil have 160 degrees of right and left shoulder flexion so that she can easily reach overhead    Time  4    Period  Weeks    Status  New      PT LONG TERM GOAL #2   Title  Pt will verbalize lymphedema risk reduction practices    Time  4    Period  Weeks    Status  New      PT LONG TERM GOAL #3   Title  Pt will be independent in a home exercise program for shoulder ROM and strength    Time  4    Period  Weeks    Status  New             Plan - 02/14/19 1704     Clinical Impression Statement  Pt is progressing well after bilateral mastectomy with immediate expanders.  She will have no further cancer treatment and surgery date for implant is unknown. She will benefit from PT to regain full ROM, learn lymphedem risk reduction and program to return to strengthening for upper body    Personal Factors and Comorbidities  Comorbidity 1    Comorbidities  recent surgery with expander and will have more surgery for implants    Examination-Activity Limitations  Reach Overhead  Stability/Clinical Decision Making  Stable/Uncomplicated    Clinical Decision Making  Low    Rehab Potential  Excellent    PT Frequency  2x / week    PT Duration  4 weeks    PT Treatment/Interventions  ADLs/Self Care Home Management;DME Instruction;Therapeutic activities;Neuromuscular re-education;Therapeutic exercise;Patient/family education;Orthotic Fit/Training;Manual techniques;Manual lymph drainage;Scar mobilization;Passive range of motion;Taping    PT Next Visit Plan  progress AROM and scapular strengthening, teach lymphedema risk reduction practices, teach Strength ABC program prior to DC    PT Home Exercise Plan  supine dowel, lower trunk rotation    Consulted and Agree with Plan of Care  Patient       Patient will benefit from skilled therapeutic intervention in order to improve the following deficits and impairments:  Decreased knowledge of precautions, Decreased knowledge of use of DME, Decreased strength, Increased fascial restricitons, Impaired flexibility, Impaired UE functional use, Pain, Impaired perceived functional ability, Decreased scar mobility, Decreased range of motion  Visit Diagnosis: 1. Aftercare following surgery for neoplasm   2. Stiffness of right shoulder, not elsewhere classified   3. Stiffness of left shoulder joint   4. Muscle weakness (generalized)        Problem List Patient Active Problem List   Diagnosis Date Noted  . Breast cancer, right  ([place]) 01/19/2019  . Malignant neoplasm of upper-outer quadrant of right breast in female, estrogen receptor positive ([place]) 08/18/2018  . Family history of colon cancer   . Family history of prostate cancer   . Cotton wool spots 05/17/2018  . IUD (intrauterine device) in place 05/17/2018   Donato Heinz. Owens Shark PT  Norwood Levo 02/14/2019, 5:12 PM  [place], [place], [address] Phone: [phone]   Fax:  [phone]  Name: Kelli [name] MRN: [id number] Date of Birth: 06/19/1971

## 2019-02-15 ENCOUNTER — Encounter: Payer: Self-pay | Admitting: Physical Therapy

## 2019-02-15 ENCOUNTER — Ambulatory Visit: Payer: BC Managed Care – PPO | Admitting: Physical Therapy

## 2019-02-15 DIAGNOSIS — Z483 Aftercare following surgery for neoplasm: Secondary | ICD-10-CM

## 2019-02-15 DIAGNOSIS — M25611 Stiffness of right shoulder, not elsewhere classified: Secondary | ICD-10-CM

## 2019-02-15 DIAGNOSIS — M6281 Muscle weakness (generalized): Secondary | ICD-10-CM

## 2019-02-15 DIAGNOSIS — M25612 Stiffness of left shoulder, not elsewhere classified: Secondary | ICD-10-CM

## 2019-02-15 NOTE — Patient Instructions (Signed)
SHOULDER: Flexion - Supine (Cane)        Cancer Rehab [id number] ° ° ° °Hold cane in both hands. Raise arms up overhead. Do not allow back to arch. Hold _5__ seconds. Do __5-10__ times; __1-2__ times a day. ° ° °SELF ASSISTED WITH OBJECT: Shoulder Abduction / Adduction - Supine ° ° ° °Hold cane with both hands. Move both arms from side to side, keep elbows straight.  Hold when stretch felt for __5__ seconds. Repeat __5-10__ times; __1-2__ times a day. Once this becomes easier progress to third picture bringing affected arm towards ear by staying out to side. Same hold for _5_seconds. Repeat  _5-10_ times, _1-2_ times/day. ° °Shoulder Blade Stretch ° ° ° °Clasp fingers behind head with elbows touching in front of face. Pull elbows back while pressing shoulder blades together. Relax and hold as tolerated, can place pillow under elbow here for comfort as needed and to allow for prolonged stretch.  °Repeat __5__ times. Do __1-2__ sessions per day. ° ° ° ° °SHOULDER: External Rotation - Supine (Cane) ° ° ° °Hold cane with both hands. Rotate arm away from body. Keep elbow on floor and next to body. _5-10__ reps per set, hold 5 seconds, _1-2__ sets per day. °Add towel to keep elbow at side. ° °Copyright © [employer]. All rights reserved.  ° ° ° °Flexion (Eccentric) - Active-Assist (Cane)          Cancer Rehab [id number] ° ° ° °Use unaffected arm to push affected arm forward. Avoid hiking shoulder (shoulder should NOT touch cheek). Keep palm relaxed. Slowly lower affected arm. °Hold stretch for _5_ seconds repeating _5-10_ times, _1-2_ times a day. ° °Abduction (Eccentric) - Active-Assist (Cane) ° ° ° °Use unaffected arm to push affected arm out to side. Avoid hiking shoulder (shoulder should NOT touch cheek). Keep palm relaxed. Slowly lower affected arm. °Hold stretch _5_ seconds repeating _5-10_ times, _1-2_ times a day. ° °Cane Exercise: Extension ° ° °Stand holding cane behind back with both hands palm-up. Lift the cane away from  body until gentle stretch felt. Do NOT lean forward.  Hold __5__ seconds. °Repeat _5-10___ times. Do _1-2___ sessions per day. ° °CHEST: Doorway, Bilateral - Standing ° ° ° °Standing in doorway, place hands on wall with elbows bent at shoulder height and place one foot in front of other. Shift weight onto front foot. Hold _10-20__ seconds. °Do _3-5_ times, _1-2_ times a day. ° ° °  °

## 2019-02-15 NOTE — Therapy (Signed)
[place], [place], [address] Phone: [phone]   Fax:  [phone]  Physical Therapy Treatment  Patient Details  Name: Kelli [name] MRN: [id number] Date of Birth: 01/03/1971 Referring Provider (PT): Thimmappa    Encounter Date: 02/15/2019  PT End of Session - 02/15/19 1326    Visit Number  2    Number of Visits  9    Date for PT Re-Evaluation  03/17/19    PT Start Time  5597    PT Stop Time  1320    PT Time Calculation (min)  45 min    Activity Tolerance  Patient tolerated treatment well    Behavior During Therapy  [facility] for tasks assessed/performed       Past Medical History:  Diagnosis Date  . Family history of colon cancer   . Family history of prostate cancer   . IUD (intrauterine device) in place 05/17/2018   June 2015  . Migraines     Past Surgical History:  Procedure Laterality Date  . BREAST BIOPSY  2004  . BREAST RECONSTRUCTION WITH PLACEMENT OF TISSUE EXPANDER AND ALLODERM Bilateral 01/19/2019   Procedure: BILATERAL BREAST RECONSTRUCTION WITH PLACEMENT OF TISSUE EXPANDERS AND ALLODERM;  Surgeon: Irene Limbo, MD;  Location: [place];  Service: Plastics;  Laterality: Bilateral;  . NIPPLE SPARING MASTECTOMY WITH SENTINEL LYMPH NODE BIOPSY Bilateral 01/19/2019   Procedure: BILATERAL NIPPLE SPARING MASTECTOMY WITH RIGHT SENTINEL LYMPH NODE MAPPING;  Surgeon: Erroll Luna, MD;  Location: [place];  Service: General;  Laterality: Bilateral;    There were no vitals filed for this visit.                    [place] Adult PT Treatment/Exercise - 02/15/19 0001      Exercises   Exercises  Shoulder;Lumbar      Lumbar Exercises: Supine   Pelvic Tilt  5 reps    Clam  5 reps   with pelvic stabalization    Heel Slides  5 reps   with pelvic stabalization      Shoulder Exercises: Supine   Protraction  AROM;Right;Left;5 reps    Diagonals  AROM;Right;Left;5 reps    Diagonals Limitations  deep breaths at end of range of motion     Other Supine Exercises  dowel rod flexion and abduction and butterfly stretch       Shoulder Exercises: Seated   Other Seated Exercises  neck and upper thoracic ROM for flex, ext, side bending and roation, also shoulder rolls for scapular circles.       Shoulder Exercises: Sidelying   External Rotation  AROM;Right;Left;5 reps    Flexion  AROM;Right;Left;5 reps    ABduction  AROM;Right;Left;5 reps    Other Sidelying Exercises  5 circles in each direction              PT Education - 02/15/19 1326    Education Details  supine and standing dowel rod shoulder exercises    Person(s) Educated  Patient    Methods  Explanation;Demonstration;Handout    Comprehension  Verbalized understanding;Returned demonstration          PT Long Term Goals - 02/14/19 1709      PT LONG TERM GOAL #1   Title  pt wil have 160 degrees of right and left shoulder flexion so that she can easily reach overhead    Time  4    Period  Weeks  Status  New      PT LONG TERM GOAL #2   Title  Pt will verbalize lymphedema risk reduction practices    Time  4    Period  Weeks    Status  New      PT LONG TERM GOAL #3   Title  Pt will be independent in a home exercise program for shoulder ROM and strength    Time  4    Period  Weeks    Status  New            Plan - 02/15/19 1327    Clinical Impression Statement  Pt is doing well with exercises and beginning stretches for shoulders and upper body.  She will do her exercise at home and given standing dowel exercise to start on Saturday or Sunday as long as she is not having increased pain    Stability/Clinical Decision Making  Stable/Uncomplicated    Rehab Potential  Excellent    PT Frequency  2x / week    PT Duration  4 weeks    PT Treatment/Interventions  ADLs/Self Care Home Management;DME Instruction;Therapeutic activities;Neuromuscular  re-education;Therapeutic exercise;Patient/family education;Orthotic Fit/Training;Manual techniques;Manual lymph drainage;Scar mobilization;Passive range of motion;Taping    PT Next Visit Plan  progress AROM and scapular strengthening, teach lymphedema risk reduction practices, teach Strength ABC program prior to DC    Consulted and Agree with Plan of Care  Patient       Patient will benefit from skilled therapeutic intervention in order to improve the following deficits and impairments:  Decreased knowledge of precautions, Decreased knowledge of use of DME, Decreased strength, Increased fascial restricitons, Impaired flexibility, Impaired UE functional use, Pain, Impaired perceived functional ability, Decreased scar mobility, Decreased range of motion  Visit Diagnosis: 1. Aftercare following surgery for neoplasm   2. Stiffness of right shoulder, not elsewhere classified   3. Stiffness of left shoulder joint   4. Muscle weakness (generalized)        Problem List Patient Active Problem List   Diagnosis Date Noted  . Breast cancer, right ([place]) 01/19/2019  . Malignant neoplasm of upper-outer quadrant of right breast in female, estrogen receptor positive ([place]) 08/18/2018  . Family history of colon cancer   . Family history of prostate cancer   . Cotton wool spots 05/17/2018  . IUD (intrauterine device) in place 05/17/2018   Donato Heinz. Owens Shark PT  Norwood Levo 02/15/2019, 1:29 PM  [place], [place], [address] Phone: [phone]   Fax:  [phone]  Name: Kelli [name] MRN: [id number] Date of Birth: 04-16-1971

## 2019-02-22 ENCOUNTER — Ambulatory Visit: Payer: BC Managed Care – PPO

## 2019-02-22 ENCOUNTER — Other Ambulatory Visit: Payer: Self-pay

## 2019-02-22 DIAGNOSIS — M25612 Stiffness of left shoulder, not elsewhere classified: Secondary | ICD-10-CM

## 2019-02-22 DIAGNOSIS — Z483 Aftercare following surgery for neoplasm: Secondary | ICD-10-CM | POA: Diagnosis not present

## 2019-02-22 DIAGNOSIS — M6281 Muscle weakness (generalized): Secondary | ICD-10-CM | POA: Diagnosis not present

## 2019-02-22 DIAGNOSIS — M25611 Stiffness of right shoulder, not elsewhere classified: Secondary | ICD-10-CM | POA: Diagnosis not present

## 2019-02-22 NOTE — Patient Instructions (Signed)

## 2019-02-22 NOTE — Therapy (Signed)
[place], [place], [address] Phone: [phone]   Fax:  [phone]  Physical Therapy Treatment  Patient Details  Name: BENTLEIGH WAREN MRN: [id number] Date of Birth: 1970-09-08 Referring Provider (PT): Thimmappa    Encounter Date: 02/22/2019  PT End of Session - 02/22/19 0916    Visit Number  3    Number of Visits  9    Date for PT Re-Evaluation  03/17/19    PT Start Time  0902    PT Stop Time  0950    PT Time Calculation (min)  48 min    Activity Tolerance  Patient tolerated treatment well    Behavior During Therapy  [facility] for tasks assessed/performed       Past Medical History:  Diagnosis Date  . Family history of colon cancer   . Family history of prostate cancer   . IUD (intrauterine device) in place 05/17/2018   June 2015  . Migraines     Past Surgical History:  Procedure Laterality Date  . BREAST BIOPSY  2004  . BREAST RECONSTRUCTION WITH PLACEMENT OF TISSUE EXPANDER AND ALLODERM Bilateral 01/19/2019   Procedure: BILATERAL BREAST RECONSTRUCTION WITH PLACEMENT OF TISSUE EXPANDERS AND ALLODERM;  Surgeon: Irene Limbo, MD;  Location: [place];  Service: Plastics;  Laterality: Bilateral;  . NIPPLE SPARING MASTECTOMY WITH SENTINEL LYMPH NODE BIOPSY Bilateral 01/19/2019   Procedure: BILATERAL NIPPLE SPARING MASTECTOMY WITH RIGHT SENTINEL LYMPH NODE MAPPING;  Surgeon: Erroll Luna, MD;  Location: [place];  Service: General;  Laterality: Bilateral;    There were no vitals filed for this visit.  Subjective Assessment - 02/22/19 0902    Subjective  The tightness at my chest seems to be improving.    Pertinent History  Breast cancer( RT:ER/PR+, Her 2 -, LT: LCIS)  diagnosed in Dec. 2019 treated with tamoxifen and then bilateral mastecomy on June 18 with 3 from right side and none from left side with immediate expander placement with 4 drains ( 2 out at  one week and 2 at 2 weeks.  Will not need radiation    Currently in Pain?  No/denies                       [facility] Adult PT Treatment/Exercise - 02/22/19 0001      Shoulder Exercises: Supine   Horizontal ABduction  Strengthening;Both;10 reps;Theraband    Theraband Level (Shoulder Horizontal ABduction)  Level 1 (Yellow)    External Rotation  Strengthening;Both;10 reps;Theraband    Theraband Level (Shoulder External Rotation)  Level 1 (Yellow)    Flexion  Strengthening;Both;5 reps;Theraband   Narrow and Wide Grip, 5 times each   Theraband Level (Shoulder Flexion)  Level 1 (Yellow)    Diagonals  Strengthening;Right;Left;5 reps;Theraband    Theraband Level (Shoulder Diagonals)  Level 1 (Yellow)    Diagonals Limitations  Pt returning therapist demo for all above exercises with good technique      Shoulder Exercises: Pulleys   Flexion  2 minutes    Flexion Limitations  Pt returned correct demo with minor VCs to decrease scapular compensations bilaterally    ABduction  2 minutes    ABduction Limitations  Returning therapist demo      Shoulder Exercises: Therapy Ball   Flexion  Both;10 reps   Forward lean into end of stretch   ABduction  Right;Left;5 reps   same side lean into end of stretch  Manual Therapy   Manual Therapy  Myofascial release;Passive ROM    Myofascial Release  Gently to Rt axilla being mindful of recently healed lumpectomy incision and UE pulling both during P/ROM    Passive ROM  In Supine to Rt UE into flexion, abduction and D2 all to pts tolerance             PT Education - 02/22/19 0915    Education Details  Supine scapular series with yellow theraband    Person(s) Educated  Patient    Methods  Explanation;Demonstration;Handout    Comprehension  Verbalized understanding;Returned demonstration;Need further instruction          PT Long Term Goals - 02/14/19 1709      PT LONG TERM GOAL #1   Title  pt wil have 160 degrees of right and  left shoulder flexion so that she can easily reach overhead    Time  4    Period  Weeks    Status  New      PT LONG TERM GOAL #2   Title  Pt will verbalize lymphedema risk reduction practices    Time  4    Period  Weeks    Status  New      PT LONG TERM GOAL #3   Title  Pt will be independent in a home exercise program for shoulder ROM and strength    Time  4    Period  Weeks    Status  New            Plan - 02/22/19 0917    Clinical Impression Statement  Issued script for compression sleeve with phone number for [facility] and instructed pt to call for appointment. Added AA/ROM stretching which pt tolerate well. Also progressed HEP to include supine scapular series with yellow theraband instructing pt to only do every other day for now so as not to overdo with recently healing tissue from surgery 5 weeks ago. Spent end of session doing manual therapy focusing on end P/ROM and myofascial release to Rt axilla where min cording palpable. Pt very tight here and will benefit from further manual therapy.    Personal Factors and Comorbidities  Comorbidity 1    Comorbidities  recent surgery with expander and will have more surgery for implants    Examination-Activity Limitations  Reach Overhead    Stability/Clinical Decision Making  Stable/Uncomplicated    Rehab Potential  Excellent    PT Frequency  2x / week    PT Duration  4 weeks    PT Treatment/Interventions  ADLs/Self Care Home Management;DME Instruction;Therapeutic activities;Neuromuscular re-education;Therapeutic exercise;Patient/family education;Orthotic Fit/Training;Manual techniques;Manual lymph drainage;Scar mobilization;Passive range of motion;Taping    PT Next Visit Plan  Cont manual therapy and progress A/ROM and scapular strengthening reviewing HEP issued today prn, teach lymphedema risk reduction practices, teach Strength ABC program prior to DC    PT Home Exercise Plan  supine dowel, lower trunk rotation, supine  scapular series    Consulted and Agree with Plan of Care  Patient       Patient will benefit from skilled therapeutic intervention in order to improve the following deficits and impairments:  Decreased knowledge of precautions, Decreased knowledge of use of DME, Decreased strength, Increased fascial restricitons, Impaired flexibility, Impaired UE functional use, Pain, Impaired perceived functional ability, Decreased scar mobility, Decreased range of motion  Visit Diagnosis: 1. Aftercare following surgery for neoplasm   2. Stiffness of right shoulder, not elsewhere classified  3. Stiffness of left shoulder joint   4. Muscle weakness (generalized)        Problem List Patient Active Problem List   Diagnosis Date Noted  . Breast cancer, right ([place]) 01/19/2019  . Malignant neoplasm of upper-outer quadrant of right breast in female, estrogen receptor positive ([place]) 08/18/2018  . Family history of colon cancer   . Family history of prostate cancer   . Cotton wool spots 05/17/2018  . IUD (intrauterine device) in place 05/17/2018    Otelia Limes, PTA 02/22/2019, 10:00 AM  [place], [place], [address] Phone: [phone]   Fax:  [phone]  Name: SHANYIAH CONDE MRN: [id number] Date of Birth: 29-Sep-1970

## 2019-02-24 ENCOUNTER — Other Ambulatory Visit: Payer: Self-pay

## 2019-02-24 ENCOUNTER — Encounter: Payer: Self-pay | Admitting: Rehabilitation

## 2019-02-24 ENCOUNTER — Ambulatory Visit: Payer: BC Managed Care – PPO | Admitting: Rehabilitation

## 2019-02-24 DIAGNOSIS — M25612 Stiffness of left shoulder, not elsewhere classified: Secondary | ICD-10-CM

## 2019-02-24 DIAGNOSIS — Z483 Aftercare following surgery for neoplasm: Secondary | ICD-10-CM

## 2019-02-24 DIAGNOSIS — M6281 Muscle weakness (generalized): Secondary | ICD-10-CM

## 2019-02-24 DIAGNOSIS — M25611 Stiffness of right shoulder, not elsewhere classified: Secondary | ICD-10-CM | POA: Diagnosis not present

## 2019-02-24 NOTE — Therapy (Signed)
[place], [place], [address] Phone: [phone]   Fax:  [phone]  Physical Therapy Treatment  Patient Details  Name: Kelli [name] MRN: [id number] Date of Birth: [birth year] Referring Provider (PT): Thimmappa    Encounter Date: 02/24/2019  PT End of Session - 02/24/19 0904    Visit Number  4    Number of Visits  9    Date for PT Re-Evaluation  03/17/19    PT Start Time  0902    PT Stop Time  0945    PT Time Calculation (min)  43 min    Activity Tolerance  Patient tolerated treatment well    Behavior During Therapy  [facility] for tasks assessed/performed       Past Medical History:  Diagnosis Date  . Family history of colon cancer   . Family history of prostate cancer   . IUD (intrauterine device) in place 05/17/2018   June 2015  . Migraines     Past Surgical History:  Procedure Laterality Date  . BREAST BIOPSY  2004  . BREAST RECONSTRUCTION WITH PLACEMENT OF TISSUE EXPANDER AND ALLODERM Bilateral 01/19/2019   Procedure: BILATERAL BREAST RECONSTRUCTION WITH PLACEMENT OF TISSUE EXPANDERS AND ALLODERM;  Surgeon: Irene Limbo, MD;  Location: [place];  Service: Plastics;  Laterality: Bilateral;  . NIPPLE SPARING MASTECTOMY WITH SENTINEL LYMPH NODE BIOPSY Bilateral 01/19/2019   Procedure: BILATERAL NIPPLE SPARING MASTECTOMY WITH RIGHT SENTINEL LYMPH NODE MAPPING;  Surgeon: Erroll Luna, MD;  Location: [place];  Service: General;  Laterality: Bilateral;    There were no vitals filed for this visit.  Subjective Assessment - 02/24/19 0903    Subjective  The Rt side just feels tighter    Pertinent History  Breast cancer( RT:ER/PR+, Her 2 -, LT: LCIS)  diagnosed in Dec. 2019 treated with tamoxifen and then bilateral mastecomy on June 18 with 3 from right side and none from left side with immediate expander placement with 4 drains ( 2 out at one week and 2 at 2  weeks.  Will not need radiation    Currently in Pain?  No/denies                       [facility] Adult PT Treatment/Exercise - 02/24/19 0001      Shoulder Exercises: Standing   Row  15 reps    Theraband Level (Shoulder Row)  Level 1 (Yellow)      Shoulder Exercises: Pulleys   Flexion  2 minutes    ABduction  2 minutes      Shoulder Exercises: Therapy Ball   Flexion  Both;10 reps    ABduction  Right;Left;5 reps      Shoulder Exercises: Stretch   Other Shoulder Stretches  gentle doorway stretch 2x20"      Manual Therapy   Passive ROM  bil all directions Rt focus with some manual stretching into horizontal abduction and ER with overhead flexion. Flexion with latissimus held/release.                   PT Long Term Goals - 02/14/19 1709      PT LONG TERM GOAL #1   Title  pt wil have 160 degrees of right and left shoulder flexion so that she can easily reach overhead    Time  4    Period  Weeks    Status  New      PT  LONG TERM GOAL #2   Title  Pt will verbalize lymphedema risk reduction practices    Time  4    Period  Weeks    Status  New      PT LONG TERM GOAL #3   Title  Pt will be independent in a home exercise program for shoulder ROM and strength    Time  4    Period  Weeks    Status  New            Plan - 02/24/19 0949    Clinical Impression Statement  Continues to improve and see ROM improvements functionally.  Pt with axillary tightnes as the biggest issue on the Rt side.  No cording evident today.  Reports the supine scap exercises are fine.    PT Treatment/Interventions  ADLs/Self Care Home Management;DME Instruction;Therapeutic activities;Neuromuscular re-education;Therapeutic exercise;Patient/family education;Orthotic Fit/Training;Manual techniques;Manual lymph drainage;Scar mobilization;Passive range of motion;Taping    PT Next Visit Plan  Cont manual therapy Rt and progress A/ROM and scapular strengthening reviewing, teach  Strength ABC program prior to DC    PT Home Exercise Plan  supine dowel, lower trunk rotation, supine scapular series    Consulted and Agree with Plan of Care  Patient       Patient will benefit from skilled therapeutic intervention in order to improve the following deficits and impairments:     Visit Diagnosis: 1. Aftercare following surgery for neoplasm   2. Stiffness of right shoulder, not elsewhere classified   3. Stiffness of left shoulder joint   4. Muscle weakness (generalized)        Problem List Patient Active Problem List   Diagnosis Date Noted  . Breast cancer, right ([place]) 01/19/2019  . Malignant neoplasm of upper-outer quadrant of right breast in female, estrogen receptor positive ([place]) 08/18/2018  . Family history of colon cancer   . Family history of prostate cancer   . Cotton wool spots 05/17/2018  . IUD (intrauterine device) in place 05/17/2018    [name] 02/24/2019, 9:51 AM  [place], [place], [address] Phone: [phone]   Fax:  [phone]  Name: Kelli [name] MRN: [id number] Date of Birth: [birth year]

## 2019-02-27 ENCOUNTER — Ambulatory Visit: Payer: BC Managed Care – PPO | Admitting: Physical Therapy

## 2019-02-27 ENCOUNTER — Other Ambulatory Visit: Payer: Self-pay

## 2019-02-27 ENCOUNTER — Encounter: Payer: Self-pay | Admitting: Physical Therapy

## 2019-02-27 DIAGNOSIS — M6281 Muscle weakness (generalized): Secondary | ICD-10-CM | POA: Diagnosis not present

## 2019-02-27 DIAGNOSIS — M25611 Stiffness of right shoulder, not elsewhere classified: Secondary | ICD-10-CM

## 2019-02-27 DIAGNOSIS — M25612 Stiffness of left shoulder, not elsewhere classified: Secondary | ICD-10-CM | POA: Diagnosis not present

## 2019-02-27 DIAGNOSIS — Z483 Aftercare following surgery for neoplasm: Secondary | ICD-10-CM | POA: Diagnosis not present

## 2019-02-27 NOTE — Therapy (Signed)
[place], [place], [address] Phone: [phone]   Fax:  [phone]  Physical Therapy Treatment  Patient Details  Name: Kelli [name] MRN: [id number] Date of Birth: 12/25/1970 Referring Provider (PT): Thimmappa    Encounter Date: 02/27/2019  PT End of Session - 02/27/19 1439    Visit Number  5    Number of Visits  9    Date for PT Re-Evaluation  03/17/19    PT Start Time  4403    PT Stop Time  1515    PT Time Calculation (min)  43 min    Activity Tolerance  Patient tolerated treatment well    Behavior During Therapy  [facility] for tasks assessed/performed       Past Medical History:  Diagnosis Date  . Family history of colon cancer   . Family history of prostate cancer   . IUD (intrauterine device) in place 05/17/2018   June 2015  . Migraines     Past Surgical History:  Procedure Laterality Date  . BREAST BIOPSY  2004  . BREAST RECONSTRUCTION WITH PLACEMENT OF TISSUE EXPANDER AND ALLODERM Bilateral 01/19/2019   Procedure: BILATERAL BREAST RECONSTRUCTION WITH PLACEMENT OF TISSUE EXPANDERS AND ALLODERM;  Surgeon: Irene Limbo, MD;  Location: [place];  Service: Plastics;  Laterality: Bilateral;  . NIPPLE SPARING MASTECTOMY WITH SENTINEL LYMPH NODE BIOPSY Bilateral 01/19/2019   Procedure: BILATERAL NIPPLE SPARING MASTECTOMY WITH RIGHT SENTINEL LYMPH NODE MAPPING;  Surgeon: Erroll Luna, MD;  Location: [place];  Service: General;  Laterality: Bilateral;    There were no vitals filed for this visit.  Subjective Assessment - 02/27/19 1436    Subjective  Pt states she is feeling better with the range of motion and is ready to progress. The second surgery is bookef for Sepetember 18    Pertinent History  Breast cancer( RT:ER/PR+, Her 2 -, LT: LCIS)  diagnosed in Dec. 2019 treated with tamoxifen and then bilateral mastecomy on June 18 with 3 from right side and  none from left side with immediate expander placement with 4 drains ( 2 out at one week and 2 at 2 weeks.  Will not need radiation    Currently in Pain?  Yes    Pain Score  1     Pain Location  Chest   by the incision , gets better with exercise   Pain Orientation  Left         [facility] PT Assessment - 02/27/19 0001      AROM   Right Shoulder Flexion  163 Degrees    Left Shoulder Flexion  165 Degrees                   [facility] Adult PT Treatment/Exercise - 02/27/19 0001      Exercises   Exercises  Other Exercises    Other Exercises   instructed in Strength After Breast Cancer Pt was able to demonstrate all exercises with good form. She feels like she will be able to do all these exercises at home       Shoulder Exercises: Standing   Other Standing Exercises  rolled dowel rod up the wall with manual assist at inferior angle of scapula for more upward rotation       Shoulder Exercises: Pulleys   Flexion  2 minutes    ABduction  2 minutes             PT Education -  02/27/19 1522    Education Details  Strength ABC program    Person(s) Educated  Patient    Methods  Explanation;Demonstration    Comprehension  Verbalized understanding;Returned demonstration          PT Long Term Goals - 02/27/19 1529      PT LONG TERM GOAL #1   Title  pt wil have 160 degrees of right and left shoulder flexion so that she can easily reach overhead    Status  Achieved      PT LONG TERM GOAL #2   Title  Pt will verbalize lymphedema risk reduction practices    Time  4    Period  Weeks    Status  On-going      PT LONG TERM GOAL #3   Title  Pt will be independent in a home exercise program for shoulder ROM and strength    Time  4    Period  Weeks    Status  On-going            Plan - 02/27/19 1523    Clinical Impression Statement  Pt is doing very well. She has met ROM goals and reports she is doing her exercises at home. She did exceptionally well with the Strength  ABC program and feels that she will be able to prgress with it at home    Comorbidities  recent surgery with expander and will have more surgery for implants    PT Treatment/Interventions  ADLs/Self Care Home Management;DME Instruction;Therapeutic activities;Neuromuscular re-education;Therapeutic exercise;Patient/family education;Orthotic Fit/Training;Manual techniques;Manual lymph drainage;Scar mobilization;Passive range of motion;Taping    PT Next Visit Plan  Reassess and answer any questions.  Likely discharge next visit       Patient will benefit from skilled therapeutic intervention in order to improve the following deficits and impairments:  Decreased knowledge of precautions, Decreased knowledge of use of DME, Decreased strength, Increased fascial restricitons, Impaired flexibility, Impaired UE functional use, Pain, Impaired perceived functional ability, Decreased scar mobility, Decreased range of motion  Visit Diagnosis: 1. Aftercare following surgery for neoplasm   2. Stiffness of right shoulder, not elsewhere classified   3. Stiffness of left shoulder joint   4. Muscle weakness (generalized)        Problem List Patient Active Problem List   Diagnosis Date Noted  . Breast cancer, right ([place]) 01/19/2019  . Malignant neoplasm of upper-outer quadrant of right breast in female, estrogen receptor positive ([place]) 08/18/2018  . Family history of colon cancer   . Family history of prostate cancer   . Cotton wool spots 05/17/2018  . IUD (intrauterine device) in place 05/17/2018   Donato Heinz. Owens Shark PT  Norwood Levo 02/27/2019, 3:30 PM  [place], [place], [address] Phone: [phone]   Fax:  [phone]  Name: Kelli [name] MRN: [id number] Date of Birth: 27-Feb-1971

## 2019-03-01 ENCOUNTER — Encounter: Payer: BC Managed Care – PPO | Admitting: Physical Therapy

## 2019-03-14 ENCOUNTER — Ambulatory Visit: Payer: BC Managed Care – PPO | Attending: Plastic Surgery | Admitting: Physical Therapy

## 2019-03-14 ENCOUNTER — Other Ambulatory Visit: Payer: Self-pay

## 2019-03-14 ENCOUNTER — Encounter: Payer: Self-pay | Admitting: Physical Therapy

## 2019-03-14 DIAGNOSIS — M25611 Stiffness of right shoulder, not elsewhere classified: Secondary | ICD-10-CM

## 2019-03-14 DIAGNOSIS — Z483 Aftercare following surgery for neoplasm: Secondary | ICD-10-CM | POA: Diagnosis not present

## 2019-03-14 DIAGNOSIS — M25612 Stiffness of left shoulder, not elsewhere classified: Secondary | ICD-10-CM | POA: Diagnosis not present

## 2019-03-14 DIAGNOSIS — M6281 Muscle weakness (generalized): Secondary | ICD-10-CM

## 2019-03-14 NOTE — Therapy (Signed)
[place], [place], [address] Phone: [phone]   Fax:  [phone]  Physical Therapy Treatment  Patient Details  Name: Kelli [name] MRN: [id number] Date of Birth: 12/07/1970 Referring Provider (PT): Thimmappa    Encounter Date: 03/14/2019  PT End of Session - 03/14/19 1050    Visit Number  6    Number of Visits  9    Date for PT Re-Evaluation  03/17/19    PT Start Time  1005    PT Stop Time  1045    PT Time Calculation (min)  40 min    Activity Tolerance  Patient tolerated treatment well    Behavior During Therapy  [facility] for tasks assessed/performed       Past Medical History:  Diagnosis Date  . Family history of colon cancer   . Family history of prostate cancer   . IUD (intrauterine device) in place 05/17/2018   June 2015  . Migraines     Past Surgical History:  Procedure Laterality Date  . BREAST BIOPSY  2004  . BREAST RECONSTRUCTION WITH PLACEMENT OF TISSUE EXPANDER AND ALLODERM Bilateral 01/19/2019   Procedure: BILATERAL BREAST RECONSTRUCTION WITH PLACEMENT OF TISSUE EXPANDERS AND ALLODERM;  Surgeon: Irene Limbo, MD;  Location: [place];  Service: Plastics;  Laterality: Bilateral;  . NIPPLE SPARING MASTECTOMY WITH SENTINEL LYMPH NODE BIOPSY Bilateral 01/19/2019   Procedure: BILATERAL NIPPLE SPARING MASTECTOMY WITH RIGHT SENTINEL LYMPH NODE MAPPING;  Surgeon: Erroll Luna, MD;  Location: [place];  Service: General;  Laterality: Bilateral;    There were no vitals filed for this visit.  Subjective Assessment - 03/14/19 1014    Subjective  pt states she is doing the strength ABC program and has progressed well.  [place] and developed right shoulder pain that she has had in the past but not since surgery. She is not having it at the current moment.  She feels like she is readyt to discharge today.    Pertinent History  Breast cancer( RT:ER/PR+, Her 2 -, LT: LCIS)  diagnosed in Dec. 2019 treated with tamoxifen and then bilateral mastecomy on June 18 with 3 from right side and none from left side with immediate expander placement with 4 drains ( 2 out at one week and 2 at 2 weeks.  Will not need radiation    Currently in Pain?  No/denies         [facility] PT Assessment - 03/14/19 0001      AROM   Right Shoulder Flexion  165 Degrees    Left Shoulder Flexion  165 Degrees           Quick Dash - 03/14/19 0001    Open a tight or new jar  Mild difficulty    Do heavy household chores (wash walls, wash floors)  Mild difficulty    Carry a shopping bag or briefcase  No difficulty    Wash your back  No difficulty    Use a knife to cut food  No difficulty    Recreational activities in which you take some force or impact through your arm, shoulder, or hand (golf, hammering, tennis)  Mild difficulty    During the past week, to what extent has your arm, shoulder or hand problem interfered with your normal social activities with family, friends, neighbors, or groups?  Not at all    During the past  week, to what extent has your arm, shoulder or hand problem limited your work or other regular daily activities  Not at all    Arm, shoulder, or hand pain.  Mild    Tingling (pins and needles) in your arm, shoulder, or hand  None    Difficulty Sleeping  No difficulty    DASH Score  9.09 %             [facility] Adult PT Treatment/Exercise - 03/14/19 0001      Self-Care   Self-Care  Other Self-Care Comments    Other Self-Care Comments   instructed in stretches and scapular ROM ex to help with neck pain. Instructed in lymphedema risk reduction and gave pt handout. Discussed type of sleeeve/gauntlet to get and when to wear it. Answered all questions about exercise program                   PT Long Term Goals - 03/14/19 1053      PT LONG TERM GOAL #1   Title  pt wil have 160 degrees of  right and left shoulder flexion so that she can easily reach overhead    Status  Achieved      PT LONG TERM GOAL #2   Title  Pt will verbalize lymphedema risk reduction practices    Status  Achieved      PT LONG TERM GOAL #3   Title  Pt will be independent in a home exercise program for shoulder ROM and strength    Status  Achieved            Plan - 03/14/19 1051    Clinical Impression Statement  Pt is doing very well .  She has achieved shoulder ROM and is doing a porgressive stregnthening program at home.  She is aware of lymphedema risk reduction.  Quick DASH score has decrased brom 36.06 to 9.09 indicating and improvement in shoulder function.  She is ready to discharge    Comorbidities  recent surgery with expander and will have more surgery for implants    PT Treatment/Interventions  ADLs/Self Care Home Management;DME Instruction;Therapeutic activities;Neuromuscular re-education;Therapeutic exercise;Patient/family education;Orthotic Fit/Training;Manual techniques;Manual lymph drainage;Scar mobilization;Passive range of motion;Taping    PT Next Visit Plan  discharge this visit    Consulted and Agree with Plan of Care  Patient       Patient will benefit from skilled therapeutic intervention in order to improve the following deficits and impairments:  Decreased knowledge of precautions, Decreased knowledge of use of DME, Decreased strength, Increased fascial restricitons, Impaired flexibility, Impaired UE functional use, Pain, Impaired perceived functional ability, Decreased scar mobility, Decreased range of motion  Visit Diagnosis: 1. Aftercare following surgery for neoplasm   2. Stiffness of right shoulder, not elsewhere classified   3. Stiffness of left shoulder joint   4. Muscle weakness (generalized)        Problem List Patient Active Problem List   Diagnosis Date Noted  . Breast cancer, right ([place]) 01/19/2019  . Malignant neoplasm of upper-outer quadrant of right  breast in female, estrogen receptor positive ([place]) 08/18/2018  . Family history of colon cancer   . Family history of prostate cancer   . Cotton wool spots 05/17/2018  . IUD (intrauterine device) in place 05/17/2018   PHYSICAL THERAPY DISCHARGE SUMMARY  Visits from Start of Care: 6  Current functional level related to goals / functional outcomes: independent in exercise and self care    Remaining deficits: intermittent neck  pain that she had prior to surgery   Education / Equipment: Home exercise, lymphedema risk reduction  Plan: Patient agrees to discharge.  Patient goals were met. Patient is being discharged due to being pleased with the current functional level.  ?????    Donato Heinz. Owens Shark PT  Norwood Levo 03/14/2019, 10:54 AM  [place], [place], [address] Phone: [phone]   Fax:  [phone]  Name: SHERMEKA RUTT MRN: [id number] Date of Birth: [birth year]

## 2019-03-16 ENCOUNTER — Encounter: Payer: BC Managed Care – PPO | Admitting: Physical Therapy

## 2019-03-30 NOTE — H&P (Signed)
Subjective:     Patient ID: Kelli [name] is a 48 y.o. female.  HPI  2.5 months post op. Scheduled for implant exchange next month.  Presented following screening MMG with RIGHT breast mass. MMG/US showed 1.4 cm mass right UOQ 11 o'clock position. Axilla benign. Biopsy showed invasive mammary carcinoma, mixed phenotype, Her2 -, ER/PR+   MRI demonstrated NME spanning 5.3 x 4.3 x 3.6 cm of right central upper breast with a discrete mass 1.8 x 1.1 x 1.6 cm and 2 adjacent satellite lesions, in total measuring 4.0 x 3.0 cm. Scattered 6-7 mm nodules within the lower RIGHT breast noted. In the LEFT breast UOQ 0.6 x 0.6 x 0.6 cm mass noted. Additional biopsies labeled RIGHT breast 2:30 with invasive mammary ca, ER/PR+, Her2-. LEFT breast UOQ LCIS intermediate grade.  Completed neoadjuvant chemotherapy with tamoxifen. Repeat MMG/US 11/2018 showed increase in size of the right breast mass at 11 o'clock position from 1.4x1.1x0.8cm to 1.4x1.4x1.2cm, decrease in size of the right breast mass at 2:30 o'clock position from1.9 x 1.2 x 1.4 cmto1.8 x 0.9 x 1.1 cm, and multiple additional areas of distortion in the right breast. Additional biopsies labeled right breast 3 o clock and 1130 with invasive mammary carcinoma.  Final pathology multifocal invasive carcinoma right breast, largest tumor 1.6 cm, margins clear, 0/3 SLN.  Oncotype 10. Patient declined genetic testing.   Prior 34 C happy with this. Right mastectomy 368 g Left 391 g  Lives with husband two kids . She is SAHM.  Review of Systems     Objective:   Physical Exam  Cardiovascular: Normal rate, regular rhythm and normal heart sounds.  Pulmonary/Chest: Effort normal and breath sounds normal.  Abdominal: Soft.   Chest:  Scars maturing soft bilateral; bilateral rippling present left nipple superior tip eschar has fallen off, all areas healed, some loss height nipple on this side. SN to nipple R 24 L 24 cm Nipple to IMF R 8 L  8 cm  Assessment:     Right breast ca UOQ ER+, LCIS left breast Neoadjuvant tamoxifen S/p bilateral NSM, prepectoral TE/ADM (Alloderm) reconstruction    Plan:     Pictures today. Plan removal bilateral tissue expanders and placement implants, lipofilling bilateral chest.  Reviewed saline vs silicone, shaped vs round. HCG or capacity filled silicone implants may offer reduced risk visible rippling. Reviewed MRI or USsurveillance for rupture with silicone implants. Reviewed no implant is permanent device and may require additional surgery.Reviewed examples for 4th generation, capacity filled 4th generation, and HCG implants vs saline implants. Reviewed risks AP flipping that may be more noticeable with 5th generation implants, may require surgery to correct. Discussed texturingpurposeand risk ALCL with this. Patient has elected for silicone. Plan smooth round capacity filled.  Reviewed purpose fat grafting to thicken flaps to minimize visible rippling, contour depression. Discussed variable take graft, fat necrosis that presents as lumps, donor site pain and compression.Plan donor site supra and infraumbilical abdomen, recommend she purchase compression garment for post op use.  Additional risks including but not limited to bleeding, seroma, hematoma, asymmetry, damage to deeper structures, need for additional surgery, DVT/PE, unacceptable cosmetic result reviewed.  Discussed risk COVID infectionthrough this elective surgery. Patient will receive COVID testing prior to surgery. Discussed even if patient receivesa negative test result, the tests in some cases may fail to detect the virus or patient maycontract COVID after the test.COVID 19 infectionbefore/during/aftersurgery may result in lead to a higher chance of complication and death.  Plan OP  surgery, no drains anticipated. Rx for Cipro given. Has oxycodone and Robaxin at home.  Natrelle 133S-FV-12-T 400 ml tissue  expanders placed bilateral, fill volume 400 ml saline bilateral   Irene Limbo, MD [facility] [phone], pin [phone]

## 2019-04-14 ENCOUNTER — Other Ambulatory Visit: Payer: Self-pay

## 2019-04-14 ENCOUNTER — Encounter (HOSPITAL_BASED_OUTPATIENT_CLINIC_OR_DEPARTMENT_OTHER): Payer: Self-pay | Admitting: *Deleted

## 2019-04-18 ENCOUNTER — Encounter (HOSPITAL_BASED_OUTPATIENT_CLINIC_OR_DEPARTMENT_OTHER)
Admission: RE | Admit: 2019-04-18 | Discharge: 2019-04-18 | Disposition: A | Discharge status: Home / Self Care | Payer: BC Managed Care – PPO | Source: Ambulatory Visit | Attending: Plastic Surgery | Admitting: Plastic Surgery

## 2019-04-18 ENCOUNTER — Encounter (HOSPITAL_COMMUNITY)
Admission: RE | Admit: 2019-04-18 | Discharge: 2019-04-18 | Disposition: A | Discharge status: Home / Self Care | Payer: BC Managed Care – PPO | Source: Ambulatory Visit | Attending: Plastic Surgery | Admitting: Plastic Surgery

## 2019-04-18 DIAGNOSIS — Z01812 Encounter for preprocedural laboratory examination: Secondary | ICD-10-CM | POA: Insufficient documentation

## 2019-04-18 DIAGNOSIS — Z20828 Contact with and (suspected) exposure to other viral communicable diseases: Secondary | ICD-10-CM | POA: Diagnosis not present

## 2019-04-18 LAB — POCT PREGNANCY, URINE: Preg Test, Ur: NEGATIVE

## 2019-04-18 NOTE — Progress Notes (Signed)
      Enhanced Recovery after Surgery  Enhanced Recovery after Surgery is a protocol used to improve the stress on your body and your recovery after surgery.  Patient Instructions  . The night before surgery:  o No food after midnight. ONLY clear liquids after midnight  . The day of surgery (if you do NOT have diabetes):  o Drink ONE (1) Pre-Surgery Clear Ensure as directed.   o This drink was given to you during your hospital  pre-op appointment visit. o The pre-op nurse will instruct you on the time to drink the  Pre-Surgery Ensure depending on your surgery time. o Finish the drink at the designated time by the pre-op nurse.  o Nothing else to drink after completing the  Pre-Surgery Clear Ensure.  . The day of surgery (if you have diabetes): o Drink ONE (1) Gatorade 2 (G2) as directed. o This drink was given to you during your hospital  pre-op appointment visit.  o The pre-op nurse will instruct you on the time to drink the   Gatorade 2 (G2) depending on your surgery time. o Color of the Gatorade may vary. Red is not allowed. o Nothing else to drink after completing the  Gatorade 2 (G2).         If you have questions, please contact your surgeon's office.  Surgical soap also given to patient with instructions for use.  Patient verbalized understanding of instructions. 

## 2019-04-19 LAB — NOVEL CORONAVIRUS, NAA (HOSP ORDER, SEND-OUT TO REF LAB; TAT 18-24 HRS): SARS-CoV-2, NAA: NOT DETECTED

## 2019-04-20 NOTE — Anesthesia Preprocedure Evaluation (Addendum)
Anesthesia Evaluation  Patient identified by MRN, date of birth, ID band Patient awake    Reviewed: Allergy & Precautions, NPO status , Patient's Chart, lab work & pertinent test results  Airway Mallampati: II  TM Distance: >3 FB Neck ROM: Full    Dental no notable dental hx. (+) Teeth Intact, Dental Advisory Given   Pulmonary neg pulmonary ROS,    Pulmonary exam normal breath sounds clear to auscultation       Cardiovascular negative cardio ROS Normal cardiovascular exam Rhythm:Regular Rate:Normal     Neuro/Psych  Headaches, negative psych ROS   GI/Hepatic negative GI ROS, Neg liver ROS,   Endo/Other  negative endocrine ROS  Renal/GU negative Renal ROS  negative genitourinary   Musculoskeletal negative musculoskeletal ROS (+)   Abdominal   Peds  Hematology negative hematology ROS (+)   Anesthesia Other Findings   Reproductive/Obstetrics                             Anesthesia Physical  Anesthesia Plan  ASA: II  Anesthesia Plan: General   Post-op Pain Management:    Induction: Intravenous  PONV Risk Score and Plan: 3 and Ondansetron, Dexamethasone, Treatment may vary due to age or medical condition and Midazolam  Airway Management Planned: Oral ETT and LMA  Additional Equipment:   Intra-op Plan:   Post-operative Plan: Extubation in OR  Informed Consent: I have reviewed the patients History and Physical, chart, labs and discussed the procedure including the risks, benefits and alternatives for the proposed anesthesia with the patient or authorized representative who has indicated his/her understanding and acceptance.     Dental advisory given  Plan Discussed with: CRNA, Anesthesiologist and Surgeon  Anesthesia Plan Comments:        Anesthesia Quick Evaluation

## 2019-04-21 ENCOUNTER — Ambulatory Visit (HOSPITAL_BASED_OUTPATIENT_CLINIC_OR_DEPARTMENT_OTHER): Payer: BC Managed Care – PPO | Admitting: Anesthesiology

## 2019-04-21 ENCOUNTER — Encounter (HOSPITAL_BASED_OUTPATIENT_CLINIC_OR_DEPARTMENT_OTHER): Payer: Self-pay | Admitting: Certified Registered"

## 2019-04-21 ENCOUNTER — Encounter (HOSPITAL_BASED_OUTPATIENT_CLINIC_OR_DEPARTMENT_OTHER)
Admission: RE | Disposition: A | Discharge status: Home / Self Care | Payer: Self-pay | Source: Home / Self Care | Attending: Plastic Surgery

## 2019-04-21 ENCOUNTER — Ambulatory Visit (HOSPITAL_BASED_OUTPATIENT_CLINIC_OR_DEPARTMENT_OTHER)
Admission: RE | Admit: 2019-04-21 | Discharge: 2019-04-21 | Disposition: A | Discharge status: Home / Self Care | Payer: BC Managed Care – PPO | Attending: Plastic Surgery | Admitting: Plastic Surgery

## 2019-04-21 ENCOUNTER — Other Ambulatory Visit: Payer: Self-pay

## 2019-04-21 DIAGNOSIS — Z9221 Personal history of antineoplastic chemotherapy: Secondary | ICD-10-CM | POA: Diagnosis not present

## 2019-04-21 DIAGNOSIS — Z853 Personal history of malignant neoplasm of breast: Secondary | ICD-10-CM | POA: Diagnosis not present

## 2019-04-21 DIAGNOSIS — Z9013 Acquired absence of bilateral breasts and nipples: Secondary | ICD-10-CM | POA: Diagnosis not present

## 2019-04-21 DIAGNOSIS — Z421 Encounter for breast reconstruction following mastectomy: Secondary | ICD-10-CM | POA: Diagnosis not present

## 2019-04-21 DIAGNOSIS — G43909 Migraine, unspecified, not intractable, without status migrainosus: Secondary | ICD-10-CM | POA: Diagnosis not present

## 2019-04-21 HISTORY — PX: LIPOSUCTION WITH LIPOFILLING: SHX6436

## 2019-04-21 HISTORY — PX: REMOVAL OF BILATERAL TISSUE EXPANDERS WITH PLACEMENT OF BILATERAL BREAST IMPLANTS: SHX6431

## 2019-04-21 SURGERY — REMOVAL, TISSUE EXPANDER, BREAST, BILATERAL, WITH BILATERAL IMPLANT IMPLANT INSERTION
Anesthesia: General | Site: Chest | Laterality: Bilateral

## 2019-04-21 MED ORDER — GABAPENTIN 300 MG PO CAPS
300.0000 mg | ORAL_CAPSULE | ORAL | Status: AC
  Administered 2019-04-21: 07:00:00 300 mg via ORAL

## 2019-04-21 MED ORDER — SUGAMMADEX SODIUM 200 MG/2ML IV SOLN
INTRAVENOUS | Status: DC | PRN
  Administered 2019-04-21: 200 mg via INTRAVENOUS

## 2019-04-21 MED ORDER — ACETAMINOPHEN 500 MG PO TABS
ORAL_TABLET | ORAL | Status: AC
  Filled 2019-04-21: qty 2

## 2019-04-21 MED ORDER — LIDOCAINE HCL (PF) 1 % IJ SOLN
INTRAMUSCULAR | Status: AC
  Filled 2019-04-21: qty 60

## 2019-04-21 MED ORDER — CHLORHEXIDINE GLUCONATE CLOTH 2 % EX PADS: 6.0000 | MEDICATED_PAD | Freq: Once | CUTANEOUS | Status: DC

## 2019-04-21 MED ORDER — MIDAZOLAM HCL 2 MG/2ML IJ SOLN
INTRAMUSCULAR | Status: AC
  Filled 2019-04-21: qty 2

## 2019-04-21 MED ORDER — FENTANYL CITRATE (PF) 100 MCG/2ML IJ SOLN
INTRAMUSCULAR | Status: AC
  Filled 2019-04-21: qty 2

## 2019-04-21 MED ORDER — ONDANSETRON HCL 4 MG/2ML IJ SOLN: 4.0000 mg | Freq: Once | INTRAMUSCULAR | Status: DC | PRN

## 2019-04-21 MED ORDER — ONDANSETRON HCL 4 MG/2ML IJ SOLN
INTRAMUSCULAR | Status: AC
  Filled 2019-04-21: qty 4

## 2019-04-21 MED ORDER — CELECOXIB 200 MG PO CAPS
200.0000 mg | ORAL_CAPSULE | ORAL | Status: AC
  Administered 2019-04-21: 07:00:00 200 mg via ORAL

## 2019-04-21 MED ORDER — PROPOFOL 500 MG/50ML IV EMUL
INTRAVENOUS | Status: DC | PRN
  Administered 2019-04-21: 25 ug/kg/min via INTRAVENOUS

## 2019-04-21 MED ORDER — PROPOFOL 500 MG/50ML IV EMUL
INTRAVENOUS | Status: AC
  Filled 2019-04-21: qty 100

## 2019-04-21 MED ORDER — CEFAZOLIN SODIUM-DEXTROSE 2-4 GM/100ML-% IV SOLN
INTRAVENOUS | Status: AC
  Filled 2019-04-21: qty 100

## 2019-04-21 MED ORDER — MEPERIDINE HCL 25 MG/ML IJ SOLN: 6.2500 mg | INTRAMUSCULAR | Status: DC | PRN

## 2019-04-21 MED ORDER — EPHEDRINE 5 MG/ML INJ
INTRAVENOUS | Status: AC
  Filled 2019-04-21: qty 20

## 2019-04-21 MED ORDER — SODIUM BICARBONATE 4 % IV SOLN
INTRAVENOUS | Status: DC | PRN
  Administered 2019-04-21: 300 mL via INTRAMUSCULAR

## 2019-04-21 MED ORDER — ACETAMINOPHEN 500 MG PO TABS
1000.0000 mg | ORAL_TABLET | ORAL | Status: AC
  Administered 2019-04-21: 07:00:00 1000 mg via ORAL

## 2019-04-21 MED ORDER — CEFAZOLIN SODIUM-DEXTROSE 2-4 GM/100ML-% IV SOLN
2.0000 g | INTRAVENOUS | Status: AC
  Administered 2019-04-21: 08:00:00 2 g via INTRAVENOUS

## 2019-04-21 MED ORDER — ACETAMINOPHEN 325 MG PO TABS: 325.0000 mg | ORAL_TABLET | ORAL | Status: DC | PRN

## 2019-04-21 MED ORDER — DEXAMETHASONE SODIUM PHOSPHATE 10 MG/ML IJ SOLN
INTRAMUSCULAR | Status: AC
  Filled 2019-04-21: qty 1

## 2019-04-21 MED ORDER — FENTANYL CITRATE (PF) 100 MCG/2ML IJ SOLN: 50.0000 ug | INTRAMUSCULAR | Status: DC | PRN

## 2019-04-21 MED ORDER — SODIUM CHLORIDE 0.9 % IV SOLN
INTRAVENOUS | Status: DC | PRN
  Administered 2019-04-21: 1000 mL

## 2019-04-21 MED ORDER — CELECOXIB 200 MG PO CAPS
ORAL_CAPSULE | ORAL | Status: AC
  Filled 2019-04-21: qty 1

## 2019-04-21 MED ORDER — OXYCODONE HCL 5 MG PO TABS
5.0000 mg | ORAL_TABLET | Freq: Once | ORAL | Status: AC | PRN
  Administered 2019-04-21: 5 mg via ORAL

## 2019-04-21 MED ORDER — OXYCODONE HCL 5 MG/5ML PO SOLN: 5.0000 mg | Freq: Once | ORAL | Status: AC | PRN

## 2019-04-21 MED ORDER — LIDOCAINE 2% (20 MG/ML) 5 ML SYRINGE
INTRAMUSCULAR | Status: AC
  Filled 2019-04-21: qty 10

## 2019-04-21 MED ORDER — FENTANYL CITRATE (PF) 100 MCG/2ML IJ SOLN
25.0000 ug | INTRAMUSCULAR | Status: DC | PRN
  Administered 2019-04-21 (×3): 50 ug via INTRAVENOUS

## 2019-04-21 MED ORDER — GABAPENTIN 300 MG PO CAPS
ORAL_CAPSULE | ORAL | Status: AC
  Filled 2019-04-21: qty 1

## 2019-04-21 MED ORDER — DEXAMETHASONE SODIUM PHOSPHATE 4 MG/ML IJ SOLN
INTRAMUSCULAR | Status: DC | PRN
  Administered 2019-04-21: 10 mg via INTRAVENOUS

## 2019-04-21 MED ORDER — SODIUM BICARBONATE 4.2 % IV SOLN
INTRAVENOUS | Status: AC
  Filled 2019-04-21: qty 20

## 2019-04-21 MED ORDER — BUPIVACAINE-EPINEPHRINE (PF) 0.25% -1:200000 IJ SOLN
INTRAMUSCULAR | Status: AC
  Filled 2019-04-21: qty 30

## 2019-04-21 MED ORDER — LIDOCAINE HCL (CARDIAC) PF 100 MG/5ML IV SOSY
PREFILLED_SYRINGE | INTRAVENOUS | Status: DC | PRN
  Administered 2019-04-21: 60 mg via INTRAVENOUS

## 2019-04-21 MED ORDER — ROCURONIUM BROMIDE 100 MG/10ML IV SOLN
INTRAVENOUS | Status: DC | PRN
  Administered 2019-04-21: 50 mg via INTRAVENOUS

## 2019-04-21 MED ORDER — LIDOCAINE 2% (20 MG/ML) 5 ML SYRINGE
INTRAMUSCULAR | Status: AC
  Filled 2019-04-21: qty 5

## 2019-04-21 MED ORDER — ONDANSETRON HCL 4 MG/2ML IJ SOLN
INTRAMUSCULAR | Status: DC | PRN
  Administered 2019-04-21: 4 mg via INTRAVENOUS

## 2019-04-21 MED ORDER — LACTATED RINGERS IV SOLN
INTRAVENOUS | Status: DC
  Administered 2019-04-21 (×2): via INTRAVENOUS

## 2019-04-21 MED ORDER — OXYCODONE HCL 5 MG PO TABS
ORAL_TABLET | ORAL | Status: AC
  Filled 2019-04-21: qty 1

## 2019-04-21 MED ORDER — ONDANSETRON HCL 4 MG/2ML IJ SOLN
INTRAMUSCULAR | Status: AC
  Filled 2019-04-21: qty 6

## 2019-04-21 MED ORDER — EPINEPHRINE PF 1 MG/ML IJ SOLN
INTRAMUSCULAR | Status: AC
  Filled 2019-04-21: qty 1

## 2019-04-21 MED ORDER — ACETAMINOPHEN 160 MG/5ML PO SOLN: 325.0000 mg | ORAL | Status: DC | PRN

## 2019-04-21 MED ORDER — MIDAZOLAM HCL 2 MG/2ML IJ SOLN
1.0000 mg | INTRAMUSCULAR | Status: DC | PRN
  Administered 2019-04-21: 08:00:00 2 mg via INTRAVENOUS

## 2019-04-21 SURGICAL SUPPLY — 91 items
BAG DECANTER FOR FLEXI CONT (MISCELLANEOUS) ×4 IMPLANT
BINDER ABDOMINAL 10 UNV 27-48 (MISCELLANEOUS) ×4 IMPLANT
BINDER ABDOMINAL 12 SM 30-45 (SOFTGOODS) IMPLANT
BINDER BREAST 3XL (GAUZE/BANDAGES/DRESSINGS) IMPLANT
BINDER BREAST LRG (GAUZE/BANDAGES/DRESSINGS) ×4 IMPLANT
BINDER BREAST MEDIUM (GAUZE/BANDAGES/DRESSINGS) IMPLANT
BINDER BREAST XLRG (GAUZE/BANDAGES/DRESSINGS) IMPLANT
BINDER BREAST XXLRG (GAUZE/BANDAGES/DRESSINGS) IMPLANT
BLADE SURG 10 STRL SS (BLADE) ×8 IMPLANT
BLADE SURG 11 STRL SS (BLADE) ×4 IMPLANT
BNDG GAUZE ELAST 4 BULKY (GAUZE/BANDAGES/DRESSINGS) ×8 IMPLANT
CANISTER LIPO FAT HARVEST (MISCELLANEOUS) ×4 IMPLANT
CANISTER SUCT 1200ML W/VALVE (MISCELLANEOUS) ×4 IMPLANT
CHLORAPREP W/TINT 26 (MISCELLANEOUS) ×12 IMPLANT
COVER BACK TABLE REUSABLE LG (DRAPES) ×4 IMPLANT
COVER MAYO STAND REUSABLE (DRAPES) ×8 IMPLANT
COVER WAND RF STERILE (DRAPES) IMPLANT
DECANTER SPIKE VIAL GLASS SM (MISCELLANEOUS) IMPLANT
DERMABOND ADVANCED (GAUZE/BANDAGES/DRESSINGS) ×4
DERMABOND ADVANCED .7 DNX12 (GAUZE/BANDAGES/DRESSINGS) ×4 IMPLANT
DRAIN CHANNEL 15F RND FF W/TCR (WOUND CARE) IMPLANT
DRAPE HALF SHEET 70X43 (DRAPES) ×8 IMPLANT
DRAPE SPLIT 6X30 W/TAPE (DRAPES) ×4 IMPLANT
DRAPE TOP ARMCOVERS (MISCELLANEOUS) ×4 IMPLANT
DRAPE UTILITY XL STRL (DRAPES) ×4 IMPLANT
DRSG PAD ABDOMINAL 8X10 ST (GAUZE/BANDAGES/DRESSINGS) ×8 IMPLANT
ELECT BLADE 4.0 EZ CLEAN MEGAD (MISCELLANEOUS) ×4
ELECT COATED BLADE 2.86 ST (ELECTRODE) ×4 IMPLANT
ELECT REM PT RETURN 9FT ADLT (ELECTROSURGICAL) ×4
ELECTRODE BLDE 4.0 EZ CLN MEGD (MISCELLANEOUS) ×2 IMPLANT
ELECTRODE REM PT RTRN 9FT ADLT (ELECTROSURGICAL) ×2 IMPLANT
EVACUATOR SILICONE 100CC (DRAIN) IMPLANT
EXTRACTOR CANIST REVOLVE STRL (CANNISTER) IMPLANT
GLOVE BIO SURGEON STRL SZ 6 (GLOVE) ×12 IMPLANT
GLOVE BIO SURGEON STRL SZ 6.5 (GLOVE) ×3 IMPLANT
GLOVE BIO SURGEON STRL SZ7 (GLOVE) ×4 IMPLANT
GLOVE BIO SURGEONS STRL SZ 6.5 (GLOVE) ×1
GLOVE BIOGEL PI IND STRL 7.0 (GLOVE) ×2 IMPLANT
GLOVE BIOGEL PI IND STRL 7.5 (GLOVE) ×4 IMPLANT
GLOVE BIOGEL PI INDICATOR 7.0 (GLOVE) ×2
GLOVE BIOGEL PI INDICATOR 7.5 (GLOVE) ×4
GOWN STRL REUS W/ TWL LRG LVL3 (GOWN DISPOSABLE) ×4 IMPLANT
GOWN STRL REUS W/ TWL XL LVL3 (GOWN DISPOSABLE) ×2 IMPLANT
GOWN STRL REUS W/TWL LRG LVL3 (GOWN DISPOSABLE) ×4
GOWN STRL REUS W/TWL XL LVL3 (GOWN DISPOSABLE) ×2
IMPL BREAST SILICONE 525CC (Breast) ×4 IMPLANT
IMPLANT BREAST SILICONE 525CC (Breast) ×8 IMPLANT
IV NS 500ML (IV SOLUTION)
IV NS 500ML BAXH (IV SOLUTION) IMPLANT
KIT FILL SYSTEM UNIVERSAL (SET/KITS/TRAYS/PACK) IMPLANT
LINER CANISTER 1000CC FLEX (MISCELLANEOUS) ×4 IMPLANT
MARKER SKIN DUAL TIP RULER LAB (MISCELLANEOUS) IMPLANT
NDL SAFETY ECLIPSE 18X1.5 (NEEDLE) ×4 IMPLANT
NEEDLE FILTER BLUNT 18X 1/2SAF (NEEDLE) ×2
NEEDLE FILTER BLUNT 18X1 1/2 (NEEDLE) ×2 IMPLANT
NEEDLE HYPO 18GX1.5 SHARP (NEEDLE) ×4
NEEDLE HYPO 25X1 1.5 SAFETY (NEEDLE) IMPLANT
NS IRRIG 1000ML POUR BTL (IV SOLUTION) ×4 IMPLANT
PACK BASIN DAY SURGERY FS (CUSTOM PROCEDURE TRAY) ×4 IMPLANT
PAD ALCOHOL SWAB (MISCELLANEOUS) ×8 IMPLANT
PENCIL BUTTON HOLSTER BLD 10FT (ELECTRODE) ×4 IMPLANT
PIN SAFETY STERILE (MISCELLANEOUS) IMPLANT
PUNCH BIOPSY DERMAL 4MM (MISCELLANEOUS) IMPLANT
SIZER BREAST REUSE GEL 525CC (SIZER) ×4
SIZER BRST REUSE GEL 525CC (SIZER) ×2 IMPLANT
SLEEVE SCD COMPRESS KNEE MED (MISCELLANEOUS) ×4 IMPLANT
SPONGE LAP 18X18 RF (DISPOSABLE) ×12 IMPLANT
STAPLER VISISTAT 35W (STAPLE) ×4 IMPLANT
SUT ETHILON 2 0 FS 18 (SUTURE) IMPLANT
SUT MNCRL AB 4-0 PS2 18 (SUTURE) ×8 IMPLANT
SUT PDS AB 2-0 CT2 27 (SUTURE) IMPLANT
SUT STEEL 2 (SUTURE) ×4 IMPLANT
SUT VIC AB 3-0 PS1 18 (SUTURE)
SUT VIC AB 3-0 PS1 18XBRD (SUTURE) IMPLANT
SUT VIC AB 3-0 SH 27 (SUTURE) ×4
SUT VIC AB 3-0 SH 27X BRD (SUTURE) ×4 IMPLANT
SUT VICRYL 4-0 PS2 18IN ABS (SUTURE) ×8 IMPLANT
SYR 10ML LL (SYRINGE) ×12 IMPLANT
SYR 20ML LL LF (SYRINGE) IMPLANT
SYR 50ML LL SCALE MARK (SYRINGE) ×12 IMPLANT
SYR BULB IRRIGATION 50ML (SYRINGE) ×8 IMPLANT
SYR CONTROL 10ML LL (SYRINGE) ×4 IMPLANT
SYR TB 1ML LL NO SAFETY (SYRINGE) ×4 IMPLANT
SYRINGE TOOMEY DISP (SYRINGE) IMPLANT
TOWEL GREEN STERILE FF (TOWEL DISPOSABLE) ×8 IMPLANT
TUBE CONNECTING 20'X1/4 (TUBING) ×1
TUBE CONNECTING 20X1/4 (TUBING) ×3 IMPLANT
TUBING INFILTRATION IT-10001 (TUBING) ×4 IMPLANT
TUBING SET GRADUATE ASPIR 12FT (MISCELLANEOUS) ×4 IMPLANT
UNDERPAD 30X36 HEAVY ABSORB (UNDERPADS AND DIAPERS) ×8 IMPLANT
YANKAUER SUCT BULB TIP NO VENT (SUCTIONS) ×4 IMPLANT

## 2019-04-21 NOTE — Transfer of Care (Signed)
Immediate Anesthesia Transfer of Care Note  Patient: Kelli [name]  Procedure(s) Performed: REMOVAL OF BILATERAL TISSUE EXPANDERS WITH PLACEMENT OF BILATERAL BREAST IMPLANTS (Bilateral Breast) LIPOFILLING FROM ABDOMEN TO BILATERAL CHEST (Bilateral Chest)  Patient Location: PACU  Anesthesia Type:General  Level of Consciousness: drowsy and patient cooperative  Airway & Oxygen Therapy: Patient Spontanous Breathing and Patient connected to face mask oxygen  Post-op Assessment: Report given to RN and Post -op Vital signs reviewed and stable  Post vital signs: Reviewed and stable  Last Vitals:  Vitals Value Taken Time  BP 120/51 04/21/19 0947  Temp    Pulse 96 04/21/19 0949  Resp 18 04/21/19 0949  SpO2 100 % 04/21/19 0949  Vitals shown include unvalidated device data.  Last Pain:  Vitals:   04/21/19 0645  TempSrc: Oral  PainSc: 0-No pain         Complications: No apparent anesthesia complications

## 2019-04-21 NOTE — Anesthesia Procedure Notes (Signed)
Procedure Name: Intubation Date/Time: 04/21/2019 7:36 AM Performed by: Signe Colt, CRNA Pre-anesthesia Checklist: Patient identified, Emergency Drugs available, Suction available and Patient being monitored Patient Re-evaluated:Patient Re-evaluated prior to induction Oxygen Delivery Method: Circle system utilized Preoxygenation: Pre-oxygenation with 100% oxygen Induction Type: IV induction Ventilation: Mask ventilation without difficulty Laryngoscope Size: Mac and 3 Grade View: Grade I Tube type: Oral Tube size: 7.0 mm Number of attempts: 1 Airway Equipment and Method: Stylet and Oral airway Placement Confirmation: ETT inserted through vocal cords under direct vision,  positive ETCO2 and breath sounds checked- equal and bilateral Secured at: 21 cm Tube secured with: Tape Dental Injury: Teeth and Oropharynx as per pre-operative assessment

## 2019-04-21 NOTE — Interval H&P Note (Signed)
History and Physical Interval Note:  04/21/2019 7:08 AM  Kelli [name]  has presented today for surgery, with the diagnosis of history breast cancer, acquired absence breasts.  The various methods of treatment have been discussed with the patient and family. After consideration of risks, benefits and other options for treatment, the patient has consented to  Procedure(s): REMOVAL OF BILATERAL TISSUE EXPANDERS WITH PLACEMENT OF BILATERAL BREAST IMPLANTS (Bilateral) POSSIBLE LIPOFILLING BILATERAL CHEST (Bilateral) as a surgical intervention.  The patient's history has been reviewed, patient examined, no change in status, stable for surgery.  I have reviewed the patient's chart and labs.  Questions were answered to the patient's satisfaction.     Arnoldo Hooker Arty Lantzy

## 2019-04-21 NOTE — Discharge Instructions (Signed)

## 2019-04-21 NOTE — Op Note (Signed)
Operative Note   DATE OF OPERATION: 9.18.20  LOCATION: [facility]-outpatient  SURGICAL DIVISION: Plastic Surgery  PREOPERATIVE DIAGNOSES:  1. History breast cancer 2. Acquired absence breasts  POSTOPERATIVE DIAGNOSES:  same  PROCEDURE:  1. Removal bilateral chest tissue expanders and placement silicone implants 2. Lipofilling from abdomen to bilateral chest 115 ml  SURGEON: Irene Limbo MD MBA  ASSISTANT: none  ANESTHESIA:  General.   EBL: 25 ml  COMPLICATIONS: None immediate.   INDICATIONS FOR PROCEDURE:  The patient, Kelli [name], is a 48 y.o. female born on 1971/07/19, is here for staged breast reconstruction following bilateral nipple sparing mastectomies with immediate prepectoral expander acellular dermis reconstruction.   FINDINGS: Complete incorporation ADM noted bilateral. 50 ml fat infiltrated over right chest, 65 ml fat infiltrated over left chest. Natrelle Inspira Smooth Round Extra Projection 525 ml implants placed bilateral, REF [id number] RIGHT SN [id number] SN [id number]  DESCRIPTION OF PROCEDURE:  The patient's operative site was marked with the patient in the preoperative area to mark sternal notch, chest midline, anterior axillary lines.Supra and infraumbilical abdomen marked for liposuction.The patientwas taken to the operating room. SCDs were placed and IV antibiotics were given. The patient's operative site was prepped and draped in a sterile fashion. A time out was performed and all information was confirmed to be correct.I began onleftside. Incision made through priorinframammary foldscar and carried through superficial fascia toacellular demis.ADM incised.Expander removed.Capsulotomies performed medially and superiorly. Sizer placed.  I then directed attention toright chest andimplant cavityentered in similar manner.Expander removed and well incorporated ADM noted. Sizer placed and patient brought to upright sitting position.  Natrelle Smooth RoundExtra Projection531mlimplant selected forbilateral chest. The patient was returned to supine position.  Stab incision made over bilaterallateral abdomen. Tumescent fluid infiltrated over supra and infraumbilical [id number] tumescent infiltrated. Power assisted liposuction performed to endpoint symmetric contour and soft tissue thickness, total lipoaspirate330ml. The fat was then washed and prepared by gravity for infiltration. Harvested fat was then infiltrated in subcutaneous plane throughout mastectomy flaps.  Each cavity irrigated with bacitracin, Ancef, gentamicinsolution.Hemostasis ensured.Each cavity then irrigated with Betadine. The implant was placed inleftchest andimplant orientation ensured. Closure completed with 3-0 vicryl to closesuperficial fascia and ADMover implant.4-0 vicryl used to close dermis followed by 4-0 monocryl subcuticular. Implant placed inrightchest cavity.Closure completedin similar fashion.Abdominal incisions approximated with simple 4-0 monocryl stitch.Tissue adhesive applied to chest incisions. Dry dressing applied, followed by breastbinderand abdominal compression garment  The patient was allowed to wake from anesthesia, extubated and taken to the recovery room in satisfactory condition.   SPECIMENS: none  DRAINS: none  Irene Limbo, MD [facility]astic & Re[facility] Surgery [phone], pin [phone]

## 2019-04-24 ENCOUNTER — Telehealth: Payer: Self-pay | Admitting: Adult Health

## 2019-04-24 NOTE — Telephone Encounter (Signed)
I talk with patient regarding schedule  

## 2019-05-01 NOTE — Anesthesia Postprocedure Evaluation (Signed)
Anesthesia Post Note  Patient: Kelli [name]  Procedure(s) Performed: REMOVAL OF BILATERAL TISSUE EXPANDERS WITH PLACEMENT OF BILATERAL BREAST IMPLANTS (Bilateral Breast) LIPOFILLING FROM ABDOMEN TO BILATERAL CHEST (Bilateral Chest)     Patient location during evaluation: PACU Anesthesia Type: General Level of consciousness: awake and alert Pain management: pain level controlled Vital Signs Assessment: post-procedure vital signs reviewed and stable Respiratory status: spontaneous breathing, nonlabored ventilation, respiratory function stable and patient connected to nasal cannula oxygen Cardiovascular status: blood pressure returned to baseline and stable Postop Assessment: no apparent nausea or vomiting Anesthetic complications: no    Last Vitals:  Vitals:   04/21/19 1030 04/21/19 1112  BP: (!) 117/57 121/60  Pulse: 88 87  Resp: 13 16  Temp:  36.6 C  SpO2: 96% 97%    Last Pain:  Vitals:   04/21/19 1112  TempSrc:   PainSc: 3    Pain Goal: Patients Stated Pain Goal: 4 (04/21/19 1023)                 Fredna Stricker

## 2019-05-11 ENCOUNTER — Telehealth: Payer: Self-pay | Admitting: Adult Health

## 2019-05-12 ENCOUNTER — Ambulatory Visit (INDEPENDENT_AMBULATORY_CARE_PROVIDER_SITE_OTHER): Payer: BC Managed Care – PPO | Admitting: Family Medicine

## 2019-05-12 ENCOUNTER — Encounter: Payer: Self-pay | Admitting: Adult Health

## 2019-05-12 ENCOUNTER — Encounter: Payer: Self-pay | Admitting: Family Medicine

## 2019-05-12 ENCOUNTER — Inpatient Hospital Stay: Payer: BC Managed Care – PPO | Attending: Adult Health | Admitting: Adult Health

## 2019-05-12 VITALS — Temp 98.2°F | Ht 66.0 in

## 2019-05-12 DIAGNOSIS — K130 Diseases of lips: Secondary | ICD-10-CM

## 2019-05-12 DIAGNOSIS — Z170 Estrogen receptor positive status [ER+]: Secondary | ICD-10-CM | POA: Diagnosis not present

## 2019-05-12 DIAGNOSIS — Z975 Presence of (intrauterine) contraceptive device: Secondary | ICD-10-CM | POA: Insufficient documentation

## 2019-05-12 DIAGNOSIS — Z8042 Family history of malignant neoplasm of prostate: Secondary | ICD-10-CM | POA: Insufficient documentation

## 2019-05-12 DIAGNOSIS — C50411 Malignant neoplasm of upper-outer quadrant of right female breast: Secondary | ICD-10-CM | POA: Diagnosis not present

## 2019-05-12 DIAGNOSIS — Z800 Family history of malignant neoplasm of digestive organs: Secondary | ICD-10-CM | POA: Insufficient documentation

## 2019-05-12 DIAGNOSIS — Z79810 Long term (current) use of selective estrogen receptor modulators (SERMs): Secondary | ICD-10-CM | POA: Insufficient documentation

## 2019-05-12 DIAGNOSIS — Z9013 Acquired absence of bilateral breasts and nipples: Secondary | ICD-10-CM | POA: Insufficient documentation

## 2019-05-12 MED ORDER — KETOCONAZOLE 2 % EX CREA: 1.0000 "application " | TOPICAL_CREAM | Freq: Two times a day (BID) | CUTANEOUS | 0 refills | Status: AC

## 2019-05-12 MED ORDER — TRIAMCINOLONE ACETONIDE 0.1 % EX CREA: 1.0000 "application " | TOPICAL_CREAM | Freq: Two times a day (BID) | CUTANEOUS | 0 refills | Status: AC

## 2019-05-12 NOTE — Progress Notes (Signed)
SURVIVORSHIP VIRTUAL VISIT:  I connected with Kelli [name] on 05/12/19 at  2:00 PM EDT by my chart video and verified that I am speaking with the correct person using two identifiers.   I discussed the limitations, risks, security and privacy concerns of performing an evaluation and management service virtually and the availability of in person appointments. I also discussed with the patient that there may be a patient responsible charge related to this service. The patient expressed understanding and agreed to proceed.   BRIEF ONCOLOGIC HISTORY:  Oncology History  Malignant neoplasm of upper-outer quadrant of right breast in female, estrogen receptor positive ([place])  07/22/2018 Initial Diagnosis   Screening detected right breast mass right breast biopsy 11 o'clock position: Invasive mammary cancer mixed histology, ER 95%, PR 95%, Ki-67 10%, HER-2 -1+ by IHC,   08/14/2018 Breast MRI   Right breast upper central portion non-mass enhancement 5.3 cm, along the medial aspect discrete mass 1.8 cm, 2 adjacent satellite nodules together make 4 cm.  Scattered 6 to 7 mm enhancing nodules lower portion right breast, left breast 0.6 cm enhancing mass    08/19/2018 Cancer Staging   Staging form: Breast, AJCC 8th Edition - Clinical: Stage IA (cT1c, cN0, cM0, G2, ER+, PR+, HER2-) - Signed by Eppie Gibson, MD on 08/19/2018   08/2018 -  Anti-estrogen oral therapy   Tamoxifen daily; plan for 10 years of therapy   01/19/2019 Surgery   Bilateral mastectomies with reconstruction ([facility] [place]): Right mastectomy: Residual invasive carcinoma, multifocal, 1.6 cm, 0/3 lymph nodes, margins negative, ER 95%, PR 95%, HER-2 -1+, Ki-67 10%, residual cancer burden 1.63, left mastectomy: LCIS   01/19/2019 Cancer Staging   Staging form: Breast, AJCC 8th Edition - Pathologic stage from 01/19/2019: No Stage Recommended (ypT1c, pN0, cM0, G2, ER+, PR+, HER2-) - Signed by Gardenia Phlegm, NP on 02/08/2019      INTERVAL HISTORY:  Ms. Kelli [name] to review her survivorship care plan detailing her treatment course for breast cancer, as well as monitoring long-term side effects of that treatment, education regarding health maintenance, screening, and overall wellness and health promotion.     Overall, Ms. Kelli [name] reports feeling quite well.  She is taking Tamoxifen daily and is tolerating it well.  She underwent reconstruction a few weeks ago doing well.    REVIEW OF SYSTEMS:  Review of Systems  Constitutional: Negative for appetite change, chills, fatigue, fever and unexpected weight change.  HENT:   Negative for hearing loss, lump/mass, mouth sores and trouble swallowing.   Eyes: Negative for eye problems and icterus.  Respiratory: Negative for chest tightness, cough and shortness of breath.   Cardiovascular: Negative for chest pain, leg swelling and palpitations.  Gastrointestinal: Negative for abdominal distention, abdominal pain, blood in stool, constipation, diarrhea, nausea and vomiting.  Endocrine: Negative for hot flashes.  Genitourinary: Negative for difficulty urinating.   Musculoskeletal: Negative for arthralgias.  Skin: Negative for itching and rash.  Neurological: Negative for dizziness, extremity weakness, headaches and numbness.  Hematological: Negative for adenopathy. Does not bruise/bleed easily.  Psychiatric/Behavioral: Negative for depression. The patient is not nervous/anxious.   Breast: Denies any new nodularity, masses, tenderness, nipple changes, or nipple discharge.      ONCOLOGY TREATMENT TEAM:  1. Surgeon:  Dr. Brantley Stage at [facility] 2. Medical Oncologist: Dr. Lindi Adie      PAST MEDICAL/SURGICAL HISTORY:  Past Medical History:  Diagnosis Date  . Family history of colon cancer   . Family history of prostate cancer   .  IUD (intrauterine device) in place   . Migraines    Past Surgical History:  Procedure Laterality Date  . BREAST BIOPSY  2004  .  BREAST RECONSTRUCTION WITH PLACEMENT OF TISSUE EXPANDER AND ALLODERM Bilateral 01/19/2019   Procedure: BILATERAL BREAST RECONSTRUCTION WITH PLACEMENT OF TISSUE EXPANDERS AND ALLODERM;  Surgeon: Irene Limbo, MD;  Location: [place];  Service: Plastics;  Laterality: Bilateral;  . LIPOSUCTION WITH LIPOFILLING Bilateral 04/21/2019   Procedure: LIPOFILLING FROM ABDOMEN TO BILATERAL CHEST;  Surgeon: Irene Limbo, MD;  Location: [place];  Service: Plastics;  Laterality: Bilateral;  . NIPPLE SPARING MASTECTOMY WITH SENTINEL LYMPH NODE BIOPSY Bilateral 01/19/2019   Procedure: BILATERAL NIPPLE SPARING MASTECTOMY WITH RIGHT SENTINEL LYMPH NODE MAPPING;  Surgeon: Erroll Luna, MD;  Location: [place];  Service: General;  Laterality: Bilateral;  . REMOVAL OF BILATERAL TISSUE EXPANDERS WITH PLACEMENT OF BILATERAL BREAST IMPLANTS Bilateral 04/21/2019   Procedure: REMOVAL OF BILATERAL TISSUE EXPANDERS WITH PLACEMENT OF BILATERAL BREAST IMPLANTS;  Surgeon: Irene Limbo, MD;  Location: [place];  Service: Plastics;  Laterality: Bilateral;     ALLERGIES:  Allergies  Allergen Reactions  . Ciprofloxacin Rash  . Doxycycline Rash  . Sulfa Antibiotics Rash     CURRENT MEDICATIONS:  Outpatient Encounter Medications as of 05/12/2019  Medication Sig  . Multiple Vitamins-Minerals (EYE VITAMINS PO) Take by mouth.  . tamoxifen (NOLVADEX) 20 MG tablet Take 1 tablet (20 mg total) by mouth daily. May resume 2 weeks after surgery   No facility-administered encounter medications on file as of 05/12/2019.      ONCOLOGIC FAMILY HISTORY:  Family History  Problem Relation Age of Onset  . Learning disabilities Son   . Arthritis Mother   . Hypertension Mother   . Cancer - Colon Mother 85  . Hearing loss Father   . Arthritis Maternal Grandmother   . Hypertension Maternal Grandfather   . Diabetes Paternal Grandmother   . Prostate cancer  Maternal Uncle      GENETIC COUNSELING/TESTING: Declined by patient  SOCIAL HISTORY:  Social History   Socioeconomic History  . Marital status: Married    Spouse name: Not on file  . Number of children: 2  . Years of education: Not on file  . Highest education level: Not on file  Occupational History  . Occupation: homemaker  Social Needs  . Financial resource strain: Not on file  . Food insecurity    Worry: Not on file    Inability: Not on file  . Transportation needs    Medical: No    Non-medical: No  Tobacco Use  . Smoking status: Never Smoker  . Smokeless tobacco: Never Used  Substance and Sexual Activity  . Alcohol use: Yes    Comment: occasionally   . Drug use: Never  . Sexual activity: Yes    Birth control/protection: Condom  Lifestyle  . Physical activity    Days per week: 4 days    Minutes per session: 60 min  . Stress: Not on file  Relationships  . Social Herbalist on phone: Not on file    Gets together: Not on file    Attends religious service: Not on file    Active member of club or organization: Not on file    Attends meetings of clubs or organizations: Not on file    Relationship status: Not on file  . Intimate partner violence    Fear of current or ex  partner: No    Emotionally abused: No    Physically abused: No    Forced sexual activity: No  Other Topics Concern  . Not on file  Social History Narrative  . Not on file     OBSERVATIONS/OBJECTIVE:   Appears well.  In no apparent distress.  Mood and behavior are normal.  Breathing non labored.  Skin visualized without rash or lesion.   LABORATORY DATA:  None for this visit.  DIAGNOSTIC IMAGING:  None for this visit.      ASSESSMENT AND PLAN:  Ms.. Kelli [name] is a pleasant 48 y.o. female with Stage IA right breast invasive ductal carcinoma, ER+/PR+/HER2-, diagnosed in 07/2018, treated with bilateral mastectomies and anti-estrogen therapy with Tamoxifen beginning in 08/2018.   She presents to the Survivorship Clinic for our initial meeting and routine follow-up post-completion of treatment for breast cancer.    1. Stage IA right breast cancer:  Ms. Kelli [name] is continuing to recover from definitive treatment for breast cancer. She will follow-up with her medical oncologist, Dr. Lindi Adie in 08/2019  with history and physical exam per surveillance protocol.  She will continue her anti-estrogen therapy with Tamoxifen. Thus far, she is tolerating the Tamoxifen well, with minimal side effects. She was instructed to make Dr. Lindi Adie or myself aware if she begins to experience any worsening side effects of the medication and I could see her back in clinic to help manage those side effects, as needed. Today, a comprehensive survivorship care plan and treatment summary was reviewed with the patient today detailing her breast cancer diagnosis, treatment course, potential late/long-term effects of treatment, appropriate follow-up care with recommendations for the future, and patient education resources.  A copy of this summary, along with a letter will be sent to the patient's primary care provider via mail/fax/In Basket message after today's visit.    2.  Menses: Her last regular cycle was 11 years ago.  She has had an IUD and previous hormone levels showed she was premenopausal.  She had her IUD removed prior to starting Tamoxifen.  She notes that she had 3 days of bleeding in August.  I let her know that this is likely just some bleeding from having had her IUD removed and she is likely perimenopausal.  She will have her labs retested in 08/2019 and was recommended to see gynecology regularly.    3. Cancer screening:  Due to Ms. Kelli [name]'s history and her age, she should receive screening for skin cancers, colon cancer, and gynecologic cancers.  The information and recommendations are listed on the patient's comprehensive care plan/treatment summary and were reviewed in detail with the patient.     4. Health maintenance and wellness promotion: Ms. Kelli [name] was encouraged to consume 5-7 servings of fruits and vegetables per day. We reviewed the "Nutrition Rainbow" handout, as well as the handout "Take Control of Your Health and Reduce Your Cancer Risk" from the [place].  She was also encouraged to engage in moderate to vigorous exercise for 30 minutes per day most days of the week. We discussed the [employer] fitness program, which is designed for cancer survivors to help them become more physically fit after cancer treatments.  She was instructed to limit her alcohol consumption and continue to abstain from tobacco use.     5. Support services/counseling: It is not uncommon for this period of the patient's cancer care trajectory to be one of many emotions and stressors.  We discussed how this can be increasingly  difficult during the times of quarantine and social distancing due to the COVID-19 pandemic.   She was given information regarding our available services and encouraged to contact me with any questions or for help enrolling in any of our support group/programs.    Follow up instructions:    -Return to cancer center in 08/2019 for f/u with Dr. Lindi Adie -Follow up with surgery 02/2020 -She is welcome to return back to the Survivorship Clinic at any time; no additional follow-up needed at this time.  -Consider referral back to survivorship as a long-term survivor for continued surveillance  The patient was provided an opportunity to ask questions and all were answered. The patient agreed with the plan and demonstrated an understanding of the instructions.   The patient was advised to call back or seek an in-person evaluation if the symptoms worsen or if the condition fails to improve as anticipated.   I provided 25 minutes of face-to-face video visit time during this encounter, and > 50% was spent counseling as documented under my assessment & plan.  Scot Dock,  NP

## 2019-05-12 NOTE — Progress Notes (Signed)
Virtual Visit via Video Note  Subjective  CC:  Chief Complaint  Patient presents with  . dry lips    red, itchy, chapped, x 44months.     I connected with Kelli [name] on 05/12/19 at 11:20 AM EDT by a video enabled telemedicine application and verified that I am speaking with the correct person using two identifiers. Location patient: Home Location provider: [facility] [place], Office Persons participating in the virtual visit: [name], Leamon Arnt, MD Lilli Light, [place] discussed the limitations of evaluation and management by telemedicine and the availability of in person appointments. The patient expressed understanding and agreed to proceed. HPI: Kelli [name] is a 48 y.o. female who was contacted today to address the problems listed above in the chief complaint. . 48 yo s/p bilateral mastectomy for breast cancer and breast reconstruction doing well;however, has had dry chapped painful lips for months. Started with angular fissures; now progressed to dry, swollen red sore lips extending beyond margins of mucosa. No oral lesions. No chemotx. No cold sores. Has tried neosporin and chap stick w/o resolution. Seemed to improve after both surgeries but then returned. Denies lip licking. Works indoors.   Assessment  1. Cheilitis   2. Lip dryness      Plan   Cheilitis and dry lips:  Vaseline. 1-2 week of tac and ketoconazole. F/u if not improving.  I discussed the assessment and treatment plan with the patient. The patient was provided an opportunity to ask questions and all were answered. The patient agreed with the plan and demonstrated an understanding of the instructions.   The patient was advised to call back or seek an in-person evaluation if the symptoms worsen or if the condition fails to improve as anticipated. Follow up: oct for cpe  Visit date not found  Meds ordered this encounter  Medications  . ketoconazole (NIZORAL) 2 %  cream    Sig: Apply 1 application topically 2 (two) times daily for 7 days. Then as needed    Dispense:  30 g    Refill:  0  . triamcinolone cream (KENALOG) 0.1 %    Sig: Apply 1 application topically 2 (two) times daily. For 1 week, then as needed    Dispense:  45 g    Refill:  0      I reviewed the patients updated PMH, FH, and SocHx.    Patient Active Problem List   Diagnosis Date Noted  . Breast cancer, right ([place]) 01/19/2019  . Malignant neoplasm of upper-outer quadrant of right breast in female, estrogen receptor positive ([place]) 08/18/2018  . Family history of colon cancer   . Family history of prostate cancer   . Cotton wool spots 05/17/2018  . IUD (intrauterine device) in place 05/17/2018   Current Meds  Medication Sig  . Multiple Vitamins-Minerals (EYE VITAMINS PO) Take by mouth.  . tamoxifen (NOLVADEX) 20 MG tablet Take 1 tablet (20 mg total) by mouth daily. May resume 2 weeks after surgery  . triamcinolone cream (KENALOG) 0.1 % Apply 1 application topically 2 (two) times daily. For 1 week, then as needed  . [DISCONTINUED] triamcinolone cream (KENALOG) 0.1 % Apply 1 application topically 2 (two) times daily. For 2 weeks, then as needed    Allergies: Patient is allergic to ciprofloxacin; doxycycline; and sulfa antibiotics. Family History: Patient family history includes Arthritis in her maternal grandmother and mother; Cancer - Colon (age of onset:  21) in her mother; Diabetes in her paternal grandmother; Hearing loss in her father; Hypertension in her maternal grandfather and mother; Learning disabilities in her son; Prostate cancer in her maternal uncle. Social History:  Patient  reports that she has never smoked. She has never used smokeless tobacco. She reports current alcohol use. She reports that she does not use drugs.  Review of Systems: Constitutional: Negative for fever malaise or anorexia Cardiovascular: negative for chest pain Respiratory: negative for SOB  or persistent cough Gastrointestinal: negative for abdominal pain  OBJECTIVE Vitals: Temp 98.2 F (36.8 C) (Temporal)   Ht 5\' 6"  (1.676 m)   BMI 23.91 kg/m  General: no acute distress , A&Ox3 Lips: dry, swollen, chapped, with surrounding erythema  Leamon Arnt, MD

## 2019-07-12 ENCOUNTER — Other Ambulatory Visit: Payer: Self-pay | Admitting: *Deleted

## 2019-07-12 ENCOUNTER — Telehealth: Payer: Self-pay | Admitting: *Deleted

## 2019-07-12 ENCOUNTER — Other Ambulatory Visit: Payer: Self-pay | Admitting: Adult Health

## 2019-07-12 DIAGNOSIS — C50411 Malignant neoplasm of upper-outer quadrant of right female breast: Secondary | ICD-10-CM

## 2019-07-12 NOTE — Telephone Encounter (Signed)
Per Wilber Bihari, NP, called and made pt aware of ultrasound appt at [facility] on 12/14 @1115am . Pt knows to arrive 15 min early. Also advised pt of regular pelvic exams. Pt verbalized understanding.

## 2019-07-17 ENCOUNTER — Other Ambulatory Visit: Payer: Self-pay

## 2019-07-17 ENCOUNTER — Ambulatory Visit (HOSPITAL_COMMUNITY)
Admission: RE | Admit: 2019-07-17 | Discharge: 2019-07-17 | Disposition: A | Discharge status: Home / Self Care | Payer: BC Managed Care – PPO | Source: Ambulatory Visit | Attending: Adult Health | Admitting: Adult Health

## 2019-07-17 ENCOUNTER — Other Ambulatory Visit: Payer: Self-pay | Admitting: Adult Health

## 2019-07-17 DIAGNOSIS — Z170 Estrogen receptor positive status [ER+]: Secondary | ICD-10-CM | POA: Insufficient documentation

## 2019-07-17 DIAGNOSIS — N939 Abnormal uterine and vaginal bleeding, unspecified: Secondary | ICD-10-CM | POA: Diagnosis not present

## 2019-07-17 DIAGNOSIS — C50911 Malignant neoplasm of unspecified site of right female breast: Secondary | ICD-10-CM | POA: Diagnosis not present

## 2019-07-17 DIAGNOSIS — C50411 Malignant neoplasm of upper-outer quadrant of right female breast: Secondary | ICD-10-CM | POA: Insufficient documentation

## 2019-07-18 ENCOUNTER — Telehealth: Payer: Self-pay

## 2019-07-18 NOTE — Telephone Encounter (Signed)
-----   Message from Gardenia Phlegm, NP sent at 07/18/2019  8:31 AM EST ----- Please call patient with normal results.  She still needs to see gynecology.    Thanks, [place] ----- Message ----- From: Interface, Rad Results In Sent: 07/17/2019   4:16 PM EST To: Gardenia Phlegm, NP

## 2019-07-18 NOTE — Telephone Encounter (Signed)
Spoke with patient to inform that [place] results are normal.  NP recommends that patient should still see gynecology.  Patient voiced understanding and thanks for call.

## 2019-08-07 DIAGNOSIS — C50911 Malignant neoplasm of unspecified site of right female breast: Secondary | ICD-10-CM | POA: Diagnosis not present

## 2019-09-04 ENCOUNTER — Other Ambulatory Visit: Payer: Self-pay | Admitting: Obstetrics and Gynecology

## 2019-09-04 DIAGNOSIS — Z6824 Body mass index (BMI) 24.0-24.9, adult: Secondary | ICD-10-CM | POA: Diagnosis not present

## 2019-09-04 DIAGNOSIS — N6310 Unspecified lump in the right breast, unspecified quadrant: Secondary | ICD-10-CM

## 2019-09-04 DIAGNOSIS — Z01419 Encounter for gynecological examination (general) (routine) without abnormal findings: Secondary | ICD-10-CM | POA: Diagnosis not present

## 2019-09-07 ENCOUNTER — Ambulatory Visit
Admission: RE | Admit: 2019-09-07 | Discharge: 2019-09-07 | Disposition: A | Discharge status: Home / Self Care | Payer: BC Managed Care – PPO | Source: Ambulatory Visit | Attending: Obstetrics and Gynecology | Admitting: Obstetrics and Gynecology

## 2019-09-07 ENCOUNTER — Other Ambulatory Visit: Payer: Self-pay

## 2019-09-07 DIAGNOSIS — N6310 Unspecified lump in the right breast, unspecified quadrant: Secondary | ICD-10-CM | POA: Diagnosis not present

## 2019-09-08 ENCOUNTER — Encounter: Payer: Self-pay | Admitting: Family Medicine

## 2019-09-08 ENCOUNTER — Ambulatory Visit (INDEPENDENT_AMBULATORY_CARE_PROVIDER_SITE_OTHER): Payer: BC Managed Care – PPO | Admitting: Family Medicine

## 2019-09-08 VITALS — BP 112/68 | HR 69 | Temp 98.4°F | Ht 66.0 in | Wt 148.8 lb

## 2019-09-08 DIAGNOSIS — Z1211 Encounter for screening for malignant neoplasm of colon: Secondary | ICD-10-CM | POA: Diagnosis not present

## 2019-09-08 DIAGNOSIS — Z853 Personal history of malignant neoplasm of breast: Secondary | ICD-10-CM | POA: Diagnosis not present

## 2019-09-08 DIAGNOSIS — Z800 Family history of malignant neoplasm of digestive organs: Secondary | ICD-10-CM

## 2019-09-08 DIAGNOSIS — Z1212 Encounter for screening for malignant neoplasm of rectum: Secondary | ICD-10-CM

## 2019-09-08 DIAGNOSIS — Z0000 Encounter for general adult medical examination without abnormal findings: Secondary | ICD-10-CM

## 2019-09-08 NOTE — Patient Instructions (Signed)
Please return in 12 months for your annual complete physical; please come fasting.  I will release your lab results to you on your MyChart account with further instructions. Please reply with any questions.  So glad you are well!  I have placed a referral to GI for your colonoscopy consult.   If you have any questions or concerns, please don't hesitate to send me a message via MyChart or call the office at [phone]. Thank you for visiting with Kelli [name] today! It's our pleasure caring for you.

## 2019-09-08 NOTE — Progress Notes (Signed)
Subjective  Chief Complaint  Patient presents with  . Annual Exam    not fasting. no new concerns today.    HPI: Kelli [name] is a 50 y.o. female who presents to [facility] [place] today for a Female Wellness Visit. She also has the concerns and/or needs as listed above in the chief complaint. These will be addressed in addition to the Health Maintenance Visit.   Wellness Visit: annual visit with health maintenance review and exam without Pap   HM: doing well. Fortunately has recovered well from double mastectomy due to right breast cancer and precancerous lesion found on left. Has good results with breast implants and if feeling well. On tamoxifen. Seen in f/u regularly. conitnues healty lifestyle and home life is good  Due colon cancer screening. First.    Assessment  1. Annual physical exam   2. Screening for colorectal cancer   3. History of right breast cancer   4. Family history of colon cancer      Plan  Female Wellness Visit:  Age appropriate Health Maintenance and Prevention measures were discussed with patient. Included topics are cancer screening recommendations, ways to keep healthy (see AVS) including dietary and exercise recommendations, regular eye and dental care, use of seat belts, and avoidance of moderate alcohol use and tobacco use. Refer to gi for colonoscopy.   BMI: discussed patient's BMI and encouraged positive lifestyle modifications to help get to or maintain a target BMI.  HM needs and immunizations were addressed and ordered. See below for orders. See HM and immunization section for updates.  Routine labs and screening tests ordered including cmp, cbc and lipids where appropriate.  Discussed recommendations regarding Vit D and calcium supplementation (see AVS)  Chronic disease management visit and/or acute problem visit:  Breast cancer s/p surgery and on tamoxifen. Doing well.  Follow up: Return in about 1 year (around  09/07/2020) for complete physical.  Orders Placed This Encounter  Procedures  . CBC with Differential/Platelet  . Comprehensive metabolic panel  . Lipid panel  . TSH  . Ambulatory referral to Gastroenterology   No orders of the defined types were placed in this encounter.     Lifestyle: Body mass index is 24.02 kg/m. Wt Readings from Last 3 Encounters:  09/08/19 148 lb 12.8 oz (67.5 kg)  04/21/19 148 lb 2.4 oz (67.2 kg)  01/19/19 141 lb 1.5 oz (64 kg)   Diet: low fat Exercise: frequently,  Soc: condoms for birth control  Patient Active Problem List   Diagnosis Date Noted  . History of right breast cancer 01/19/2019    S/p bilateral mastectomy and with implants 2020 Had precancerous lesions in left breast as well. Failed conservative mgt   . Family history of colon cancer   . Family history of prostate cancer   . Cotton wool spots 05/17/2018   Health Maintenance  Topic Date Due  . COLONOSCOPY  07/24/2019  . PAP SMEAR-Modifier  05/26/2023  . TETANUS/TDAP  05/27/2029  . INFLUENZA VACCINE  Completed  . HIV Screening  Completed   Immunization History  Administered Date(s) Administered  . Influenza-Unspecified 05/24/2018, 05/05/2019  . Tdap 05/28/2019   We updated and reviewed the patient's past history in detail and it is documented below. Allergies: Patient is allergic to ciprofloxacin; doxycycline; and sulfa antibiotics. Past Medical History Patient  has a past medical history of Family history of colon cancer, Family history of prostate cancer, Malignant neoplasm of upper-outer quadrant of right  breast in female, estrogen receptor positive ([place]) (08/18/2018), and Migraines. Past Surgical History Patient  has a past surgical history that includes Breast biopsy (2004); Nipple sparing mastectomy with sentinel lymph node biopsy (Bilateral, 01/19/2019); Breast Reconstruction with placement of Tissue Expander and Alloderm (Bilateral, 01/19/2019); Removal of bilateral tissue  expanders with placement of bilateral breast implants (Bilateral, 04/21/2019); and Liposuction with lipofilling (Bilateral, 04/21/2019). Family History: Patient family history includes Arthritis in her maternal grandmother and mother; Cancer - Colon (age of onset: 76) in her mother; Diabetes in her paternal grandmother; Hearing loss in her father; Hypertension in her maternal grandfather and mother; Learning disabilities in her son; Prostate cancer in her maternal uncle. Social History:  Patient  reports that she has never smoked. She has never used smokeless tobacco. She reports current alcohol use. She reports that she does not use drugs.  Review of Systems: Constitutional: negative for fever or malaise Ophthalmic: negative for photophobia, double vision or loss of vision Cardiovascular: negative for chest pain, dyspnea on exertion, or new LE swelling Respiratory: negative for SOB or persistent cough Gastrointestinal: negative for abdominal pain, change in bowel habits or melena Genitourinary: negative for dysuria or gross hematuria, no abnormal uterine bleeding or disharge Musculoskeletal: negative for new gait disturbance or muscular weakness Integumentary: negative for new or persistent rashes, no breast lumps Neurological: negative for TIA or stroke symptoms Psychiatric: negative for SI or delusions Allergic/Immunologic: negative for hives  Patient Care Team    Relationship Specialty Notifications Start End  Leamon Arnt, MD PCP - General Family Medicine  05/17/18   Eppie Gibson, MD Attending Physician Radiation Oncology  04/21/19   Irene Limbo, MD Consulting Physician Plastic Surgery  04/21/19   Nicholas Lose, MD Consulting Physician Hematology and Oncology  04/21/19   Erroll Luna, MD Consulting Physician General Surgery  04/21/19   Marylynn Pearson, MD Consulting Physician Obstetrics and Gynecology  09/08/19     Objective  Vitals: BP 112/68 (BP Location: Left Arm, Patient  Position: Sitting, Cuff Size: Normal)   Pulse 69   Temp 98.4 F (36.9 C) (Temporal)   Ht 5\' 6"  (1.676 m)   Wt 148 lb 12.8 oz (67.5 kg)   SpO2 99%   BMI 24.02 kg/m  General:  Well developed, well nourished, no acute distress  Psych:  Alert and orientedx3,normal mood and affect HEENT:  Normocephalic, atraumatic, non-icteric sclera, PERRL, oropharynx is clear without mass or exudate, supple neck without adenopathy, mass or thyromegaly Cardiovascular:  Normal S1, S2, RRR without gallop, rub or murmur, nondisplaced PMI Respiratory:  Good breath sounds bilaterally, CTAB with normal respiratory effort Gastrointestinal: normal bowel sounds, soft, non-tender, no noted masses. No HSM MSK: no deformities, contusions. Joints are without erythema or swelling. Spine and CVA region are nontender Skin:  Warm, no rashes or suspicious lesions noted Neurologic:    Mental status is normal. CN 2-11 are normal. Gross motor and sensory exams are normal. Normal gait. No tremor    Commons side effects, risks, benefits, and alternatives for medications and treatment plan prescribed today were discussed, and the patient expressed understanding of the given instructions. Patient is instructed to call or message via MyChart if he/she has any questions or concerns regarding our treatment plan. No barriers to understanding were identified. We discussed Red Flag symptoms and signs in detail. Patient expressed understanding regarding what to do in case of urgent or emergency type symptoms.   Medication list was reconciled, printed and provided to the patient in AVS. Patient  instructions and summary information was reviewed with the patient as documented in the AVS. This note was prepared with assistance of Dragon voice recognition software. Occasional wrong-word or sound-a-like substitutions may have occurred due to the inherent limitations of voice recognition software  This visit occurred during the SARS-CoV-2 public  health emergency.  Safety protocols were in place, including screening questions prior to the visit, additional usage of staff PPE, and extensive cleaning of exam room while observing appropriate contact time as indicated for disinfecting solutions.

## 2019-09-09 LAB — COMPREHENSIVE METABOLIC PANEL
AG Ratio: 1.4 (calc) (ref 1.0–2.5)
ALT: 10 U/L (ref 6–29)
AST: 11 U/L (ref 10–35)
Albumin: 3.9 g/dL (ref 3.6–5.1)
Alkaline phosphatase (APISO): 41 U/L (ref 37–153)
BUN: 10 mg/dL (ref 7–25)
CO2: 25 mmol/L (ref 20–32)
Calcium: 9.2 mg/dL (ref 8.6–10.4)
Chloride: 107 mmol/L (ref 98–110)
Creat: 0.58 mg/dL (ref 0.50–1.05)
Globulin: 2.7 g/dL (calc) (ref 1.9–3.7)
Glucose, Bld: 107 mg/dL — ABNORMAL HIGH (ref 65–99)
Potassium: 3.9 mmol/L (ref 3.5–5.3)
Sodium: 141 mmol/L (ref 135–146)
Total Bilirubin: 0.6 mg/dL (ref 0.2–1.2)
Total Protein: 6.6 g/dL (ref 6.1–8.1)

## 2019-09-09 LAB — CBC WITH DIFFERENTIAL/PLATELET
Absolute Monocytes: 618 cells/uL (ref 200–950)
Basophils Absolute: 43 cells/uL (ref 0–200)
Basophils Relative: 0.6 %
Eosinophils Absolute: 50 cells/uL (ref 15–500)
Eosinophils Relative: 0.7 %
HCT: 36.1 % (ref 35.0–45.0)
Hemoglobin: 12.2 g/dL (ref 11.7–15.5)
Lymphs Abs: 1789 cells/uL (ref 850–3900)
MCH: 31.7 pg (ref 27.0–33.0)
MCHC: 33.8 g/dL (ref 32.0–36.0)
MCV: 93.8 fL (ref 80.0–100.0)
MPV: 11.3 fL (ref 7.5–12.5)
Monocytes Relative: 8.7 %
Neutro Abs: 4601 cells/uL (ref 1500–7800)
Neutrophils Relative %: 64.8 %
Platelets: 279 10*3/uL (ref 140–400)
RBC: 3.85 10*6/uL (ref 3.80–5.10)
RDW: 12 % (ref 11.0–15.0)
Total Lymphocyte: 25.2 %
WBC: 7.1 10*3/uL (ref 3.8–10.8)

## 2019-09-09 LAB — LIPID PANEL
Cholesterol: 137 mg/dL (ref <200)
HDL: 48 mg/dL — ABNORMAL LOW (ref >=50)
LDL Cholesterol (Calc): 75 mg/dL (calc)
Non-HDL Cholesterol (Calc): 89 mg/dL (calc) (ref <130)
Total CHOL/HDL Ratio: 2.9 (calc) (ref <5.0)
Triglycerides: 59 mg/dL (ref <150)

## 2019-09-09 LAB — TSH: TSH: 1.35 mIU/L

## 2019-10-23 IMAGING — MG MM BREAST LOCALIZATION CLIP
2 series · 2 of 2 positions shown · non-contrast
Comparison: Previous exam(s).

CLINICAL DATA: Status post ultrasound-guided core needle biopsy a
right breast mass.

EXAM:
DIAGNOSTIC RIGHT MAMMOGRAM POST ULTRASOUND BIOPSY

[R ML]
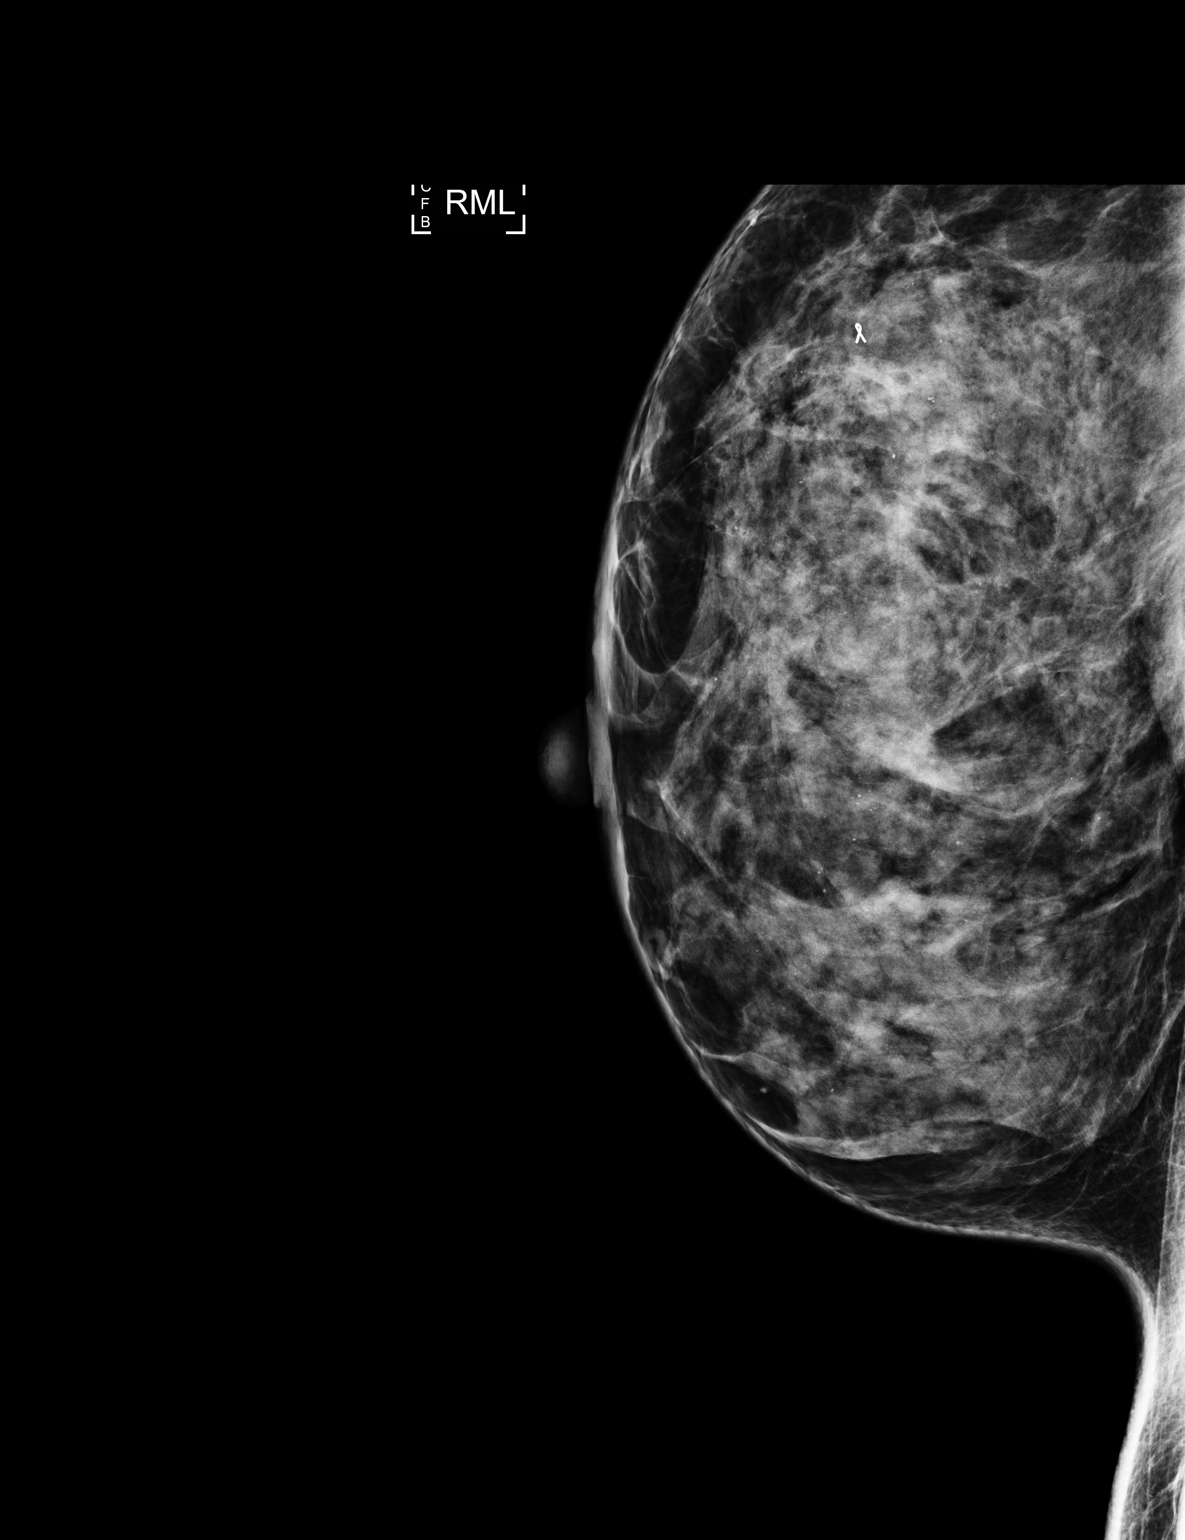

[R CC]
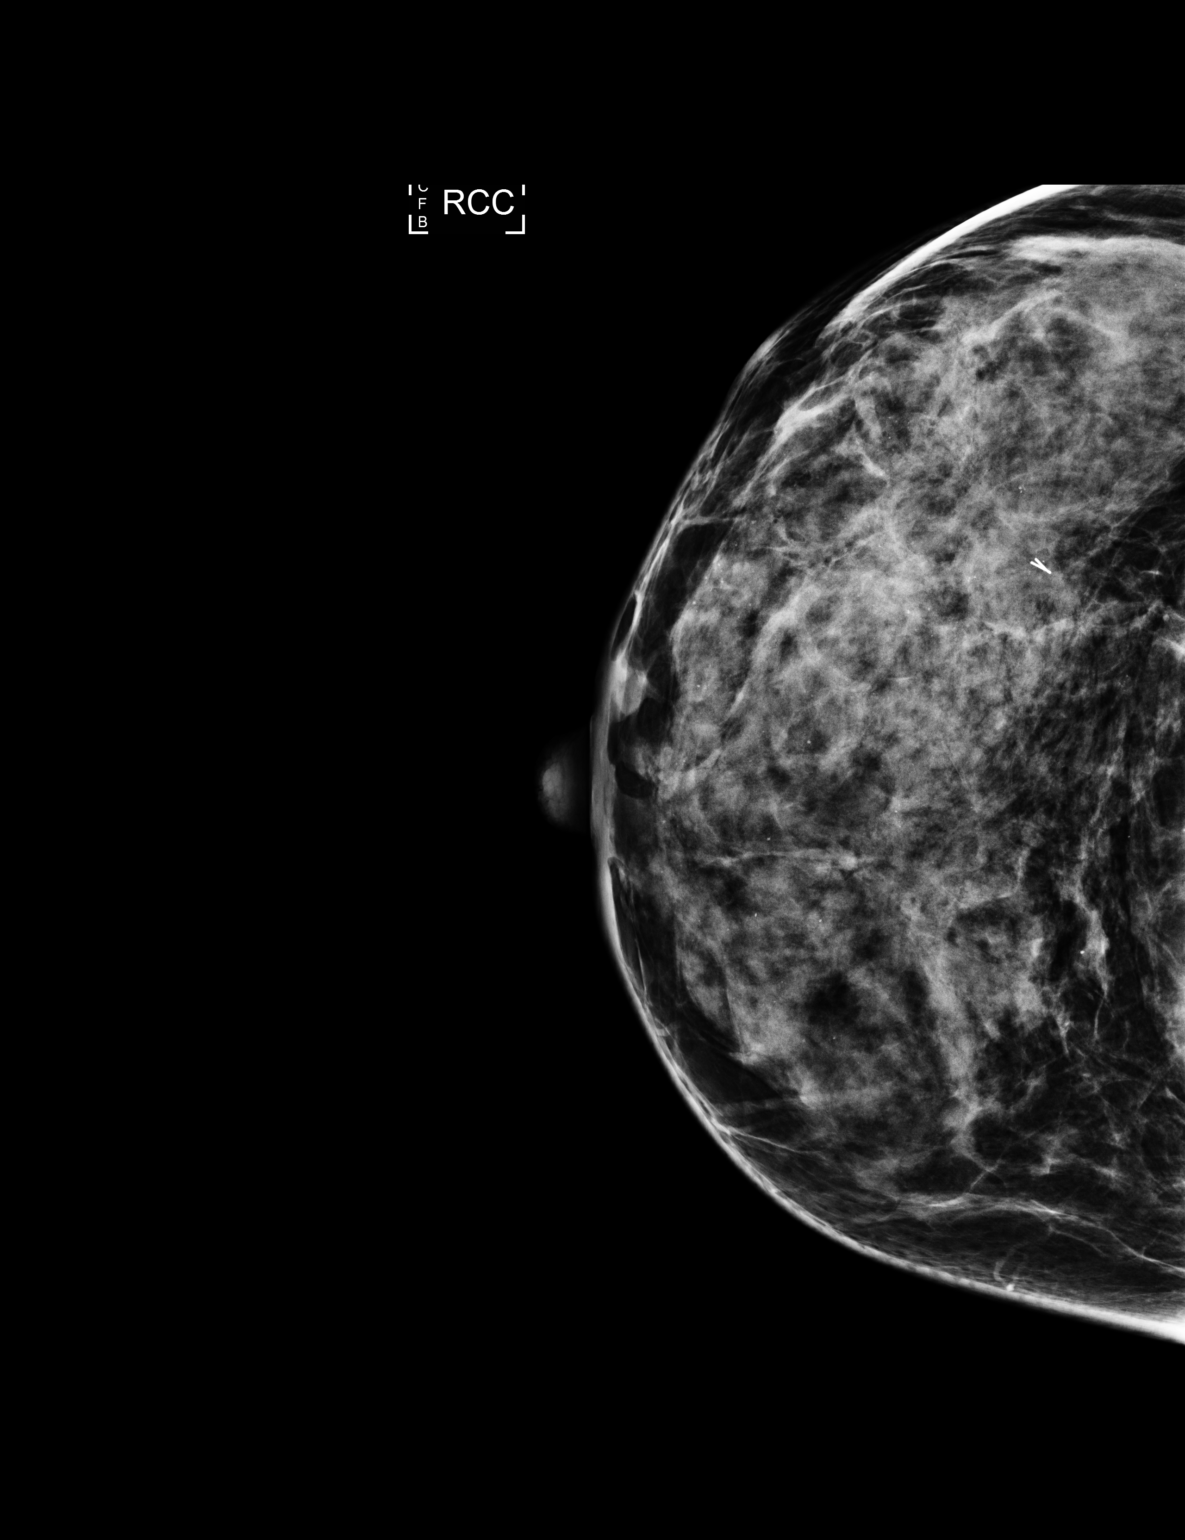

[2 of 2 positions shown; findings below may reference images not displayed]

FINDINGS: Mammographic images were obtained following ultrasound guided biopsy
of a right breast mass. The ribbon shaped biopsy clip lies in upper
outer right breast in the expected location of the mass.
IMPRESSION: Well-positioned ribbon shaped biopsy clip following
ultrasound-guided core needle biopsy of a right breast mass.

Final Assessment: Post Procedure Mammograms for Marker Placement

## 2019-11-17 ENCOUNTER — Encounter: Payer: Self-pay | Admitting: Family Medicine

## 2019-11-21 ENCOUNTER — Encounter: Payer: Self-pay | Admitting: Gastroenterology

## 2019-11-25 IMAGING — MG MM CLIP PLACEMENT
2 series · 2 of 2 positions shown · non-contrast
Comparison: Previous exam(s).

CLINICAL DATA: Status post ultrasound-guided core needle biopsy of
a right breast mass. Patient has known carcinoma in the upper outer
right breast, but had more extensive abnormal enhancement on staging
breast MRI including an upper inner quadrant mass. An ultrasound
correlate was detected and biopsied.

EXAM:
DIAGNOSTIC RIGHT MAMMOGRAM POST ULTRASOUND BIOPSY

[R CC]
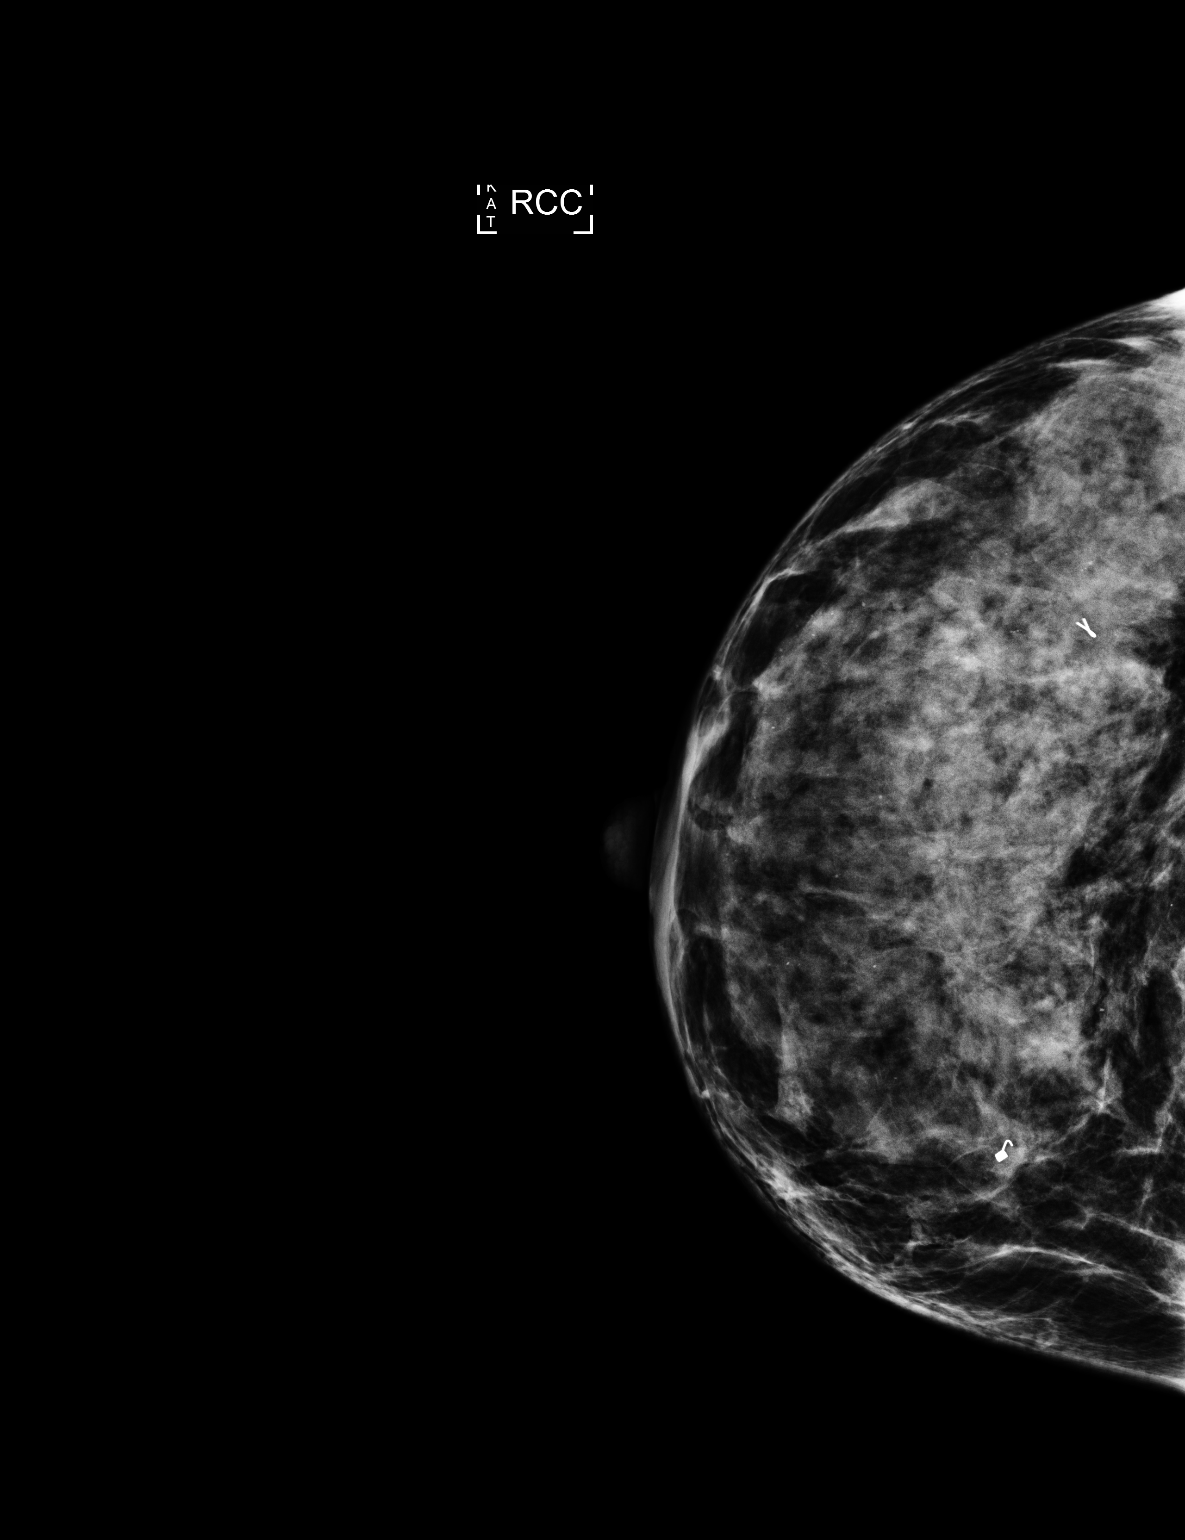

[R ML]
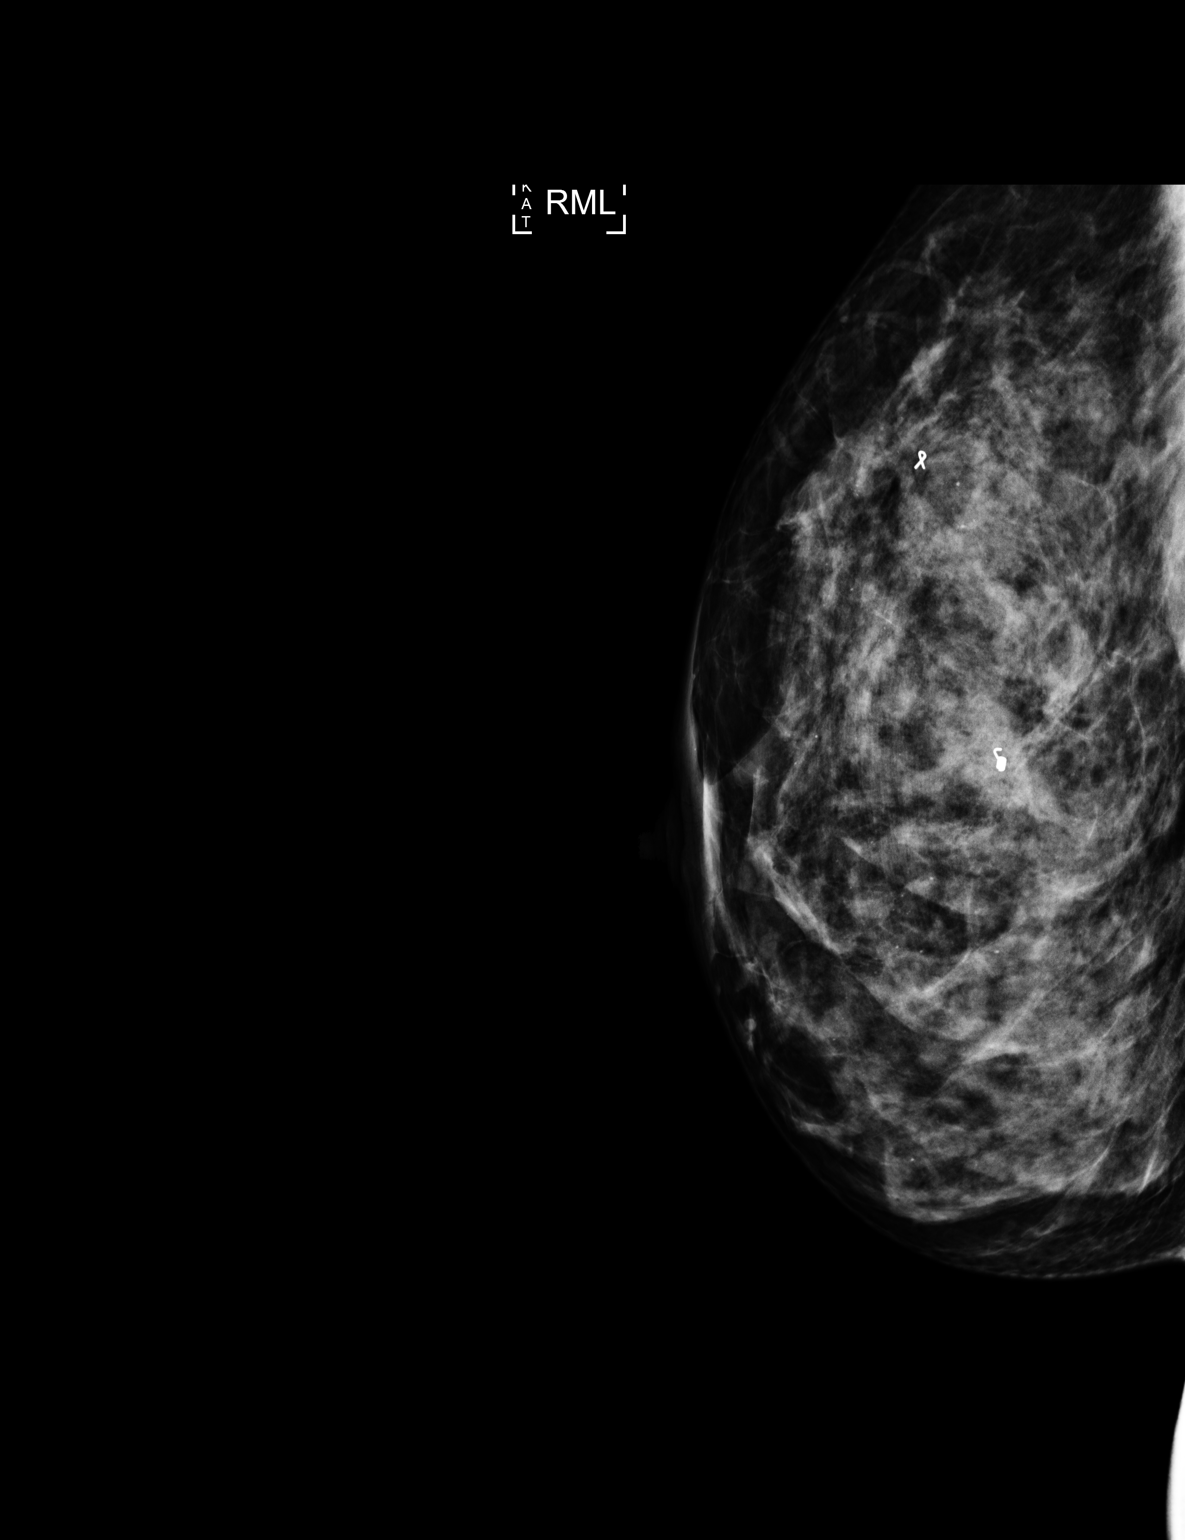

[2 of 2 positions shown; findings below may reference images not displayed]

FINDINGS: Mammographic images were obtained following ultrasound guided biopsy
of an upper inner quadrant right breast mass. The coil shaped biopsy
clip lies in the expected location of the upper-outer quadrant mass.
IMPRESSION: Well-positioned coil shaped biopsy clip following ultrasound-guided
core needle biopsy of the right breast.

Final Assessment: Post Procedure Mammograms for Marker Placement

## 2019-11-25 IMAGING — US US BREAST BX W LOC DEV 1ST LESION IMG BX SPEC US GUIDE*R*
1 series · 13 of 14 positions shown · non-contrast
Comparison: Previous exam(s).

Addendum:
CLINICAL DATA: Patient presents for ultrasound-guided core needle
biopsy a 1.9 cm mass at 2:30 o'clock in the right breast. She has
recently diagnosed invasive right breast carcinoma, diagnosed from
the upper outer quadrant of the right breast. Staging breast MRI
showed more extensive right breast abnormal enhancement including a
1.8 cm enhancing upper inner quadrant mass for which an ultrasound
correlate was located. Biopsy will be performed of this ultrasound
and MRI detected mass.

EXAM:
ULTRASOUND GUIDED RIGHT BREAST CORE NEEDLE BIOPSY

[Series 1: us breast bx w loc dev 1st lesion img bx spec us g · 0.07mm/px · 13 of 14 slices shown]
[im 1/14]
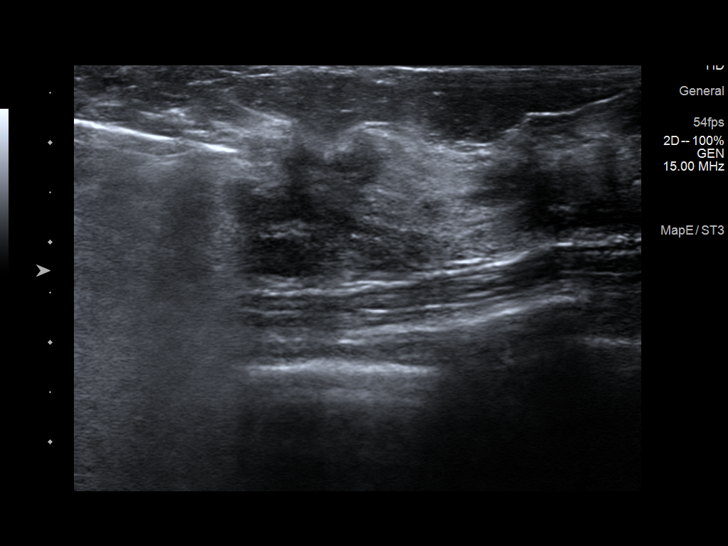
[im 2/14]
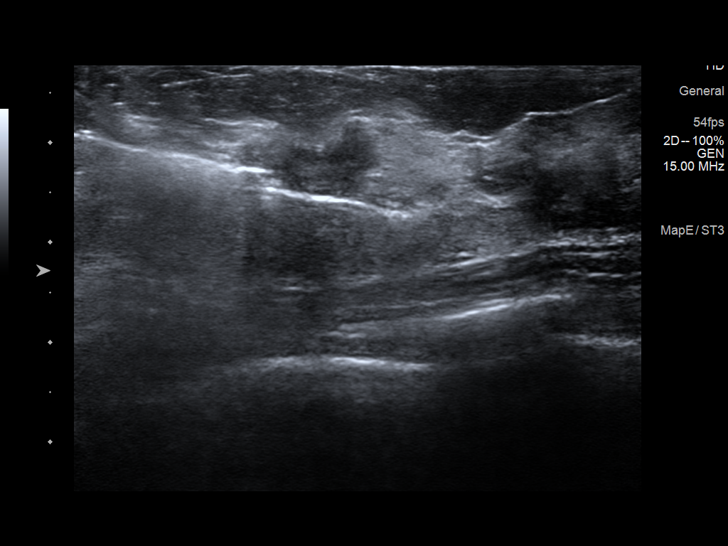
[im 3/14]
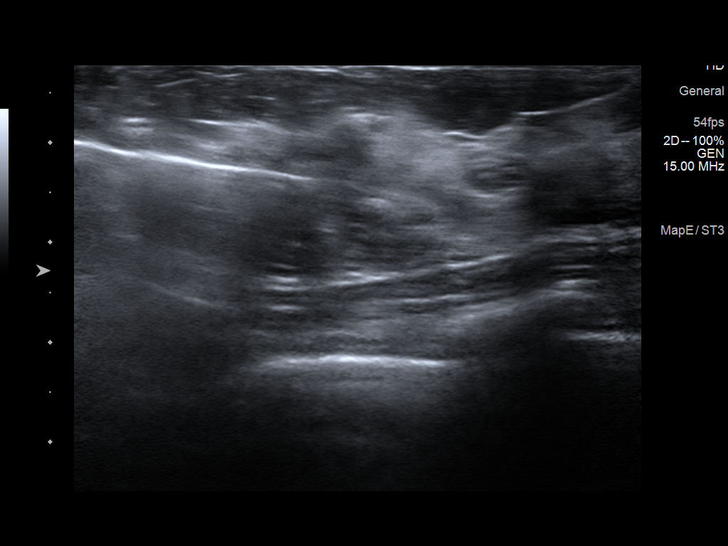
[im 4/14]
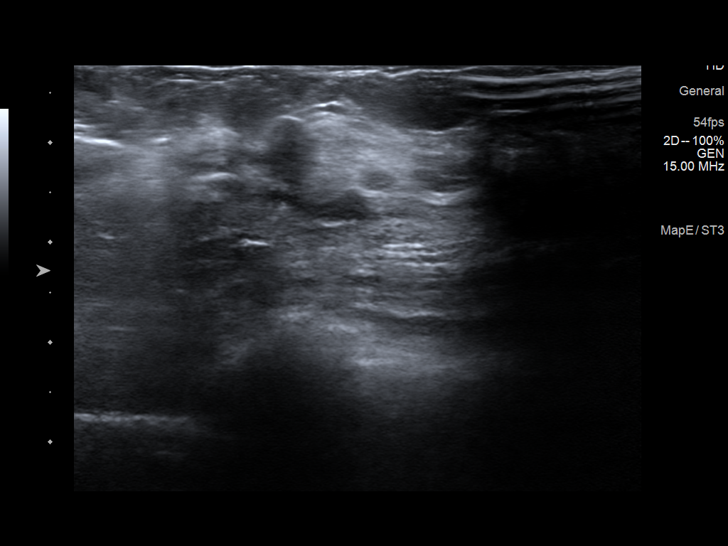
[im 5/14]
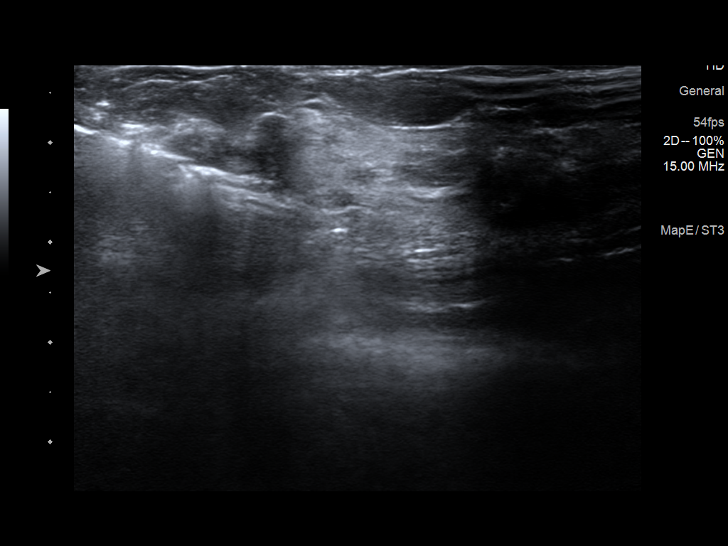
[im 6/14]
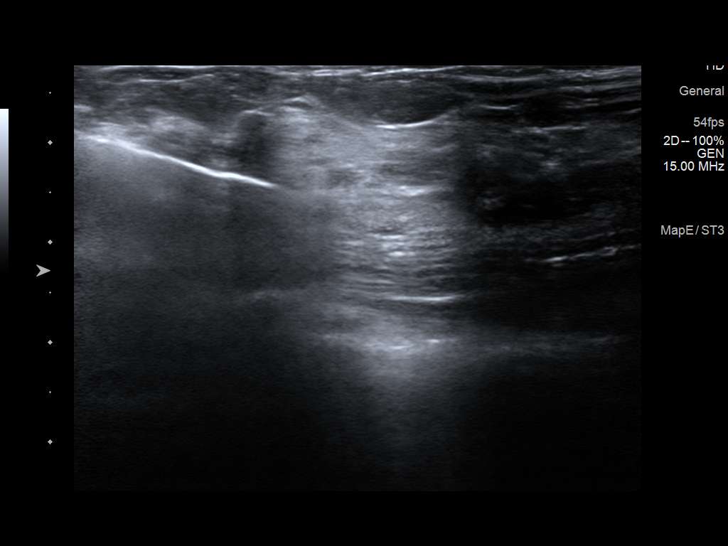
[im 8/14]
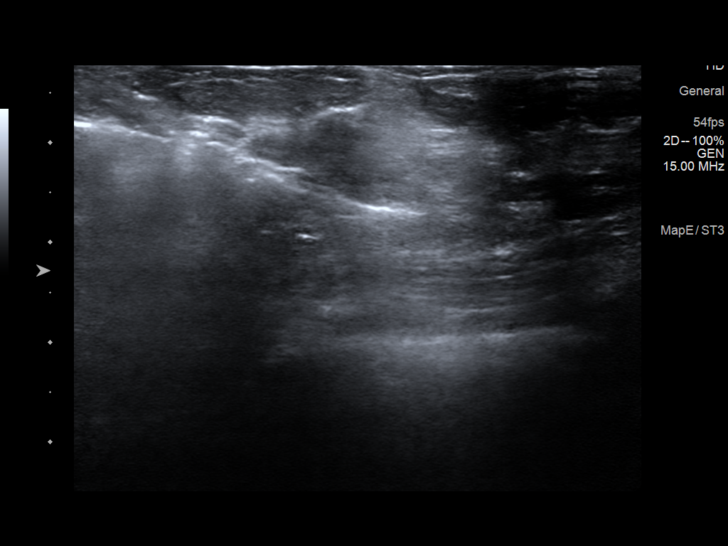
[im 9/14]
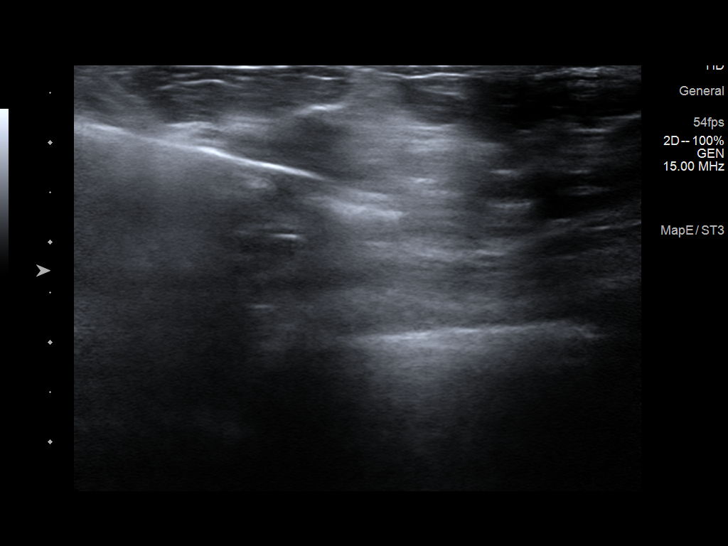
[im 10/14]
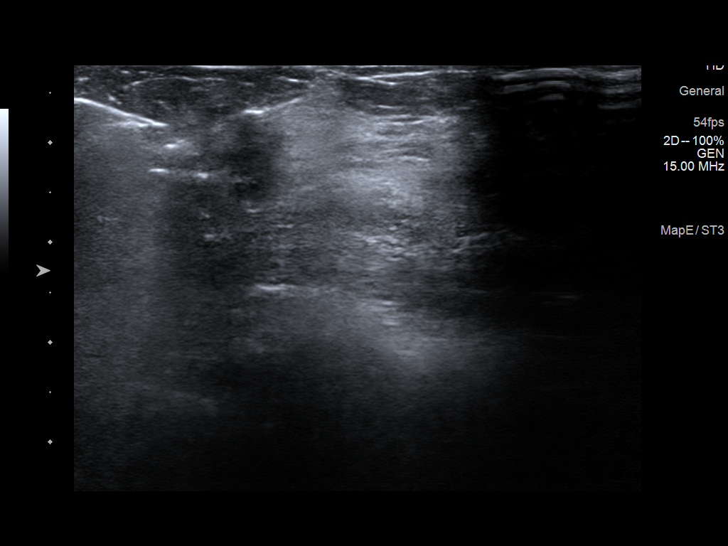
[im 11/14]
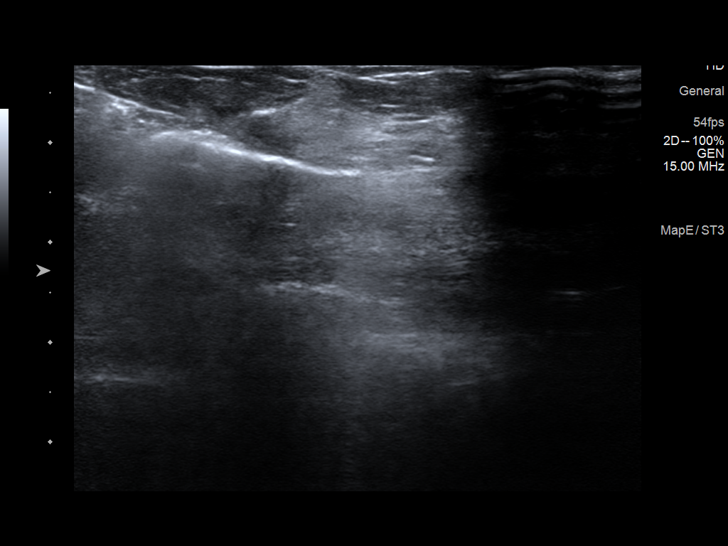
[im 12/14]
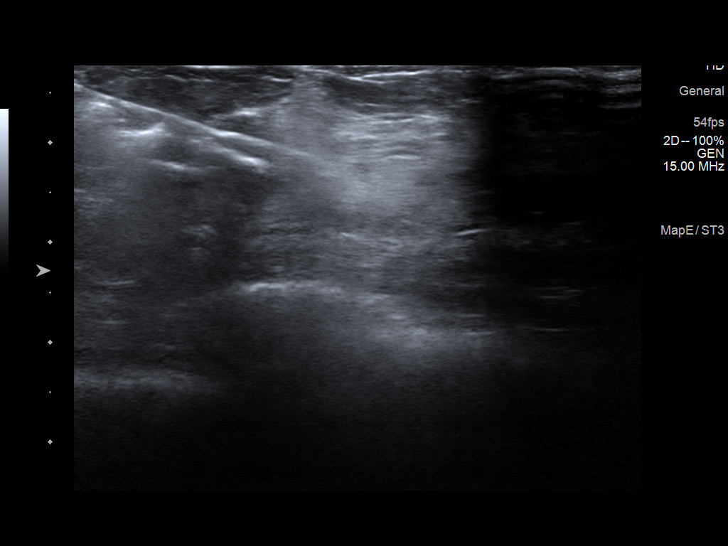
[im 13/14]
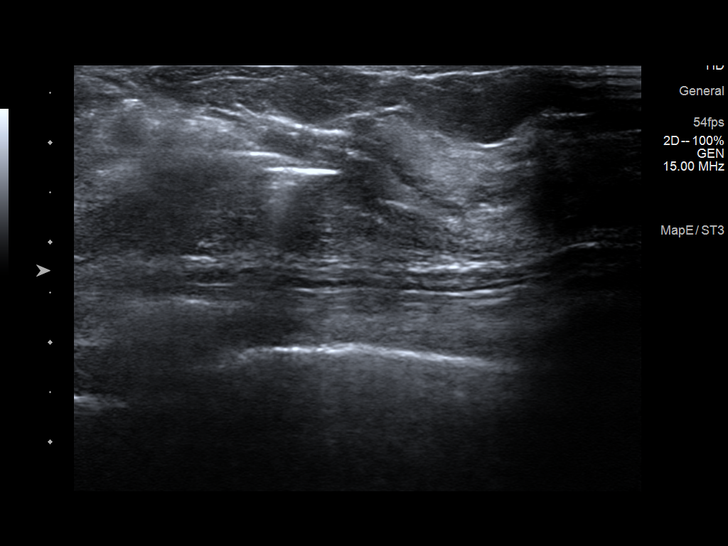
[im 14/14]
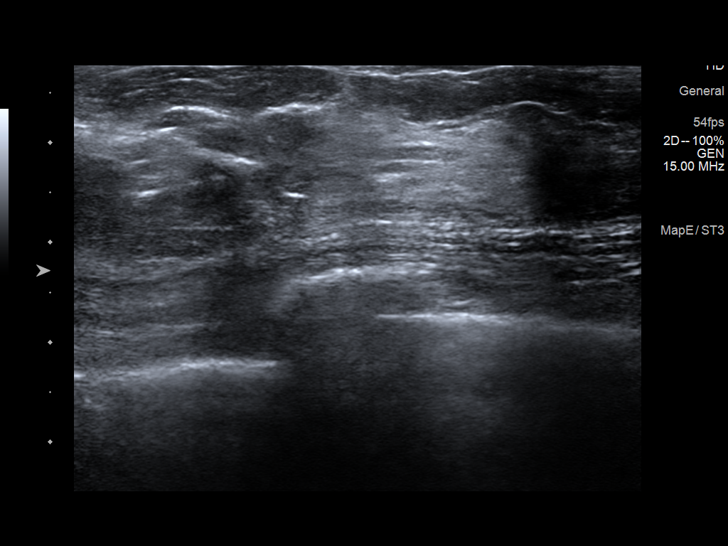

[13 of 14 positions shown; findings below may reference images not displayed]



Lesion quadrant: Upper inner quadrant

Using sterile technique and 1% Lidocaine as local anesthetic, under
direct ultrasound visualization, a 12 gauge [place] device was
used to perform biopsy of the 2:30 o'clock position 1.9 cm mass
using a inferomedial approach. At the conclusion of the procedure a
coil shaped tissue marker clip was deployed into the biopsy cavity.
Follow up 2 view mammogram was performed and dictated separately.
IMPRESSION: Ultrasound guided biopsy of a 2:30 o'clock position right breast
mass. No apparent complications.

ADDENDUM:
Pathology revealed GRADE I-II INVASIVE MAMMARY CARCINOMA of the
RIGHT breast, 2:30 o'clock. This was found to be concordant by Dr.
Auntyjatty Delowr.

Pathology results were discussed with the patient by telephone. The
patient reported doing well after the biopsy with tenderness at the
site. Post biopsy instructions and care were reviewed and questions
were answered. The patient was encouraged to call The [REDACTED]

The patient has a recent diagnosis of RIGHT breast cancer and should
follow her outlined treatment plan. The patient is scheduled for a
LEFT MRI guided biopsy at [HOSPITAL] on August 26, 2018.

Dr. Farisse Maniar and Dr. Vyresnysis Toks were notified of biopsy
results via [REDACTED] message.

Pathology results reported by Ourari Tiger, RN on 08/25/2018.

*** End of Addendum ***

## 2019-11-27 NOTE — Progress Notes (Signed)
Patient Care Team: Leamon Arnt, MD as PCP - General (Family Medicine) Eppie Gibson, MD as Attending Physician (Radiation Oncology) Irene Limbo, MD as Consulting Physician (Plastic Surgery) Nicholas Lose, MD as Consulting Physician (Hematology and Oncology) Erroll Luna, MD as Consulting Physician (General Surgery) Marylynn Pearson, MD as Consulting Physician (Obstetrics and Gynecology)  DIAGNOSIS:    ICD-10-CM   1. Malignant neoplasm of upper-outer quadrant of right breast in female, estrogen receptor positive ([place])  C50.411    Z17.0     SUMMARY OF ONCOLOGIC HISTORY: Oncology History  Malignant neoplasm of upper-outer quadrant of right breast in female, estrogen receptor positive ([place])  07/22/2018 Initial Diagnosis   Screening detected right breast mass right breast biopsy 11 o'clock position: Invasive mammary cancer mixed histology, ER 95%, PR 95%, Ki-67 10%, HER-2 -1+ by IHC,   08/14/2018 Breast MRI   Right breast upper central portion non-mass enhancement 5.3 cm, along the medial aspect discrete mass 1.8 cm, 2 adjacent satellite nodules together make 4 cm.  Scattered 6 to 7 mm enhancing nodules lower portion right breast, left breast 0.6 cm enhancing mass    08/19/2018 Cancer Staging   Staging form: Breast, AJCC 8th Edition - Clinical: Stage IA (cT1c, cN0, cM0, G2, ER+, PR+, HER2-) - Signed by Eppie Gibson, MD on 08/19/2018   08/2018 -  Anti-estrogen oral therapy   Tamoxifen daily; plan for 10 years of therapy   01/19/2019 Surgery   Bilateral mastectomies with reconstruction ([facility]): Right mastectomy: Residual invasive carcinoma, multifocal, 1.6 cm, 0/3 lymph nodes, margins negative, ER 95%, PR 95%, HER-2 -1+, Ki-67 10%, residual cancer burden 1.63, left mastectomy: LCIS   01/19/2019 Cancer Staging   Staging form: Breast, AJCC 8th Edition - Pathologic stage from 01/19/2019: No Stage Recommended (ypT1c, pN0, cM0, G2, ER+, PR+, HER2-) - Signed by  Gardenia Phlegm, NP on 02/08/2019     CHIEF COMPLIANT: Follow-up of right breast cancer on tamoxifen  INTERVAL HISTORY: Kelli [name] is a 49 y.o. with above-mentioned history of right breast cancer treated with neoadjuvant tamoxifen therapy, bilateral mastectomies with reconstruction, and is currently on antiestrogen therapy with tamoxifen. She experienced vaginal spotting after IUD removal, and transvaginal US on 07/17/19 showed no abnormalities. Right breast US on 09/07/19 after the patient palpated a mass showed no evidence of malignancy. She presents to the clinic today for follow-up.  ALLERGIES:  is allergic to ciprofloxacin; doxycycline; and sulfa antibiotics.  MEDICATIONS:  Current Outpatient Medications  Medication Sig Dispense Refill  . Multiple Vitamins-Minerals (EYE VITAMINS PO) Take by mouth.    . tamoxifen (NOLVADEX) 20 MG tablet Take 1 tablet (20 mg total) by mouth daily. May resume 2 weeks after surgery 90 tablet 3  . triamcinolone cream (KENALOG) 0.1 % Apply 1 application topically 2 (two) times daily. For 1 week, then as needed 45 g 0   No current facility-administered medications for this visit.    PHYSICAL EXAMINATION: ECOG PERFORMANCE STATUS: 1 - Symptomatic but completely ambulatory  Vitals:   11/28/19 0827  BP: 120/63  Pulse: 65  Resp: 18  Temp: 98.2 F (36.8 C)  SpO2: 100%   Filed Weights   11/28/19 0827  Weight: 151 lb 14.4 oz (68.9 kg)    BREAST: No palpable masses or nodules in either right or left breasts. No palpable axillary supraclavicular or infraclavicular adenopathy no breast tenderness or nipple discharge. (exam performed in the presence of a chaperone)  LABORATORY DATA:  I have reviewed the data as  listed CMP Latest Ref Rng & Units 09/08/2019 01/17/2019 05/17/2018  Glucose 65 - 99 mg/dL 107(H) 117(H) 127(H)  BUN 7 - 25 mg/dL '10 12 14  ' Creatinine 0.50 - 1.05 mg/dL 0.58 0.83 0.62  Sodium 135 - 146 mmol/L 141 139 140  Potassium 3.5  - 5.3 mmol/L 3.9 4.1 3.6  Chloride 98 - 110 mmol/L 107 107 103  CO2 20 - 32 mmol/L '25 24 30  ' Calcium 8.6 - 10.4 mg/dL 9.2 9.3 9.5  Total Protein 6.1 - 8.1 g/dL 6.6 6.9 7.1  Total Bilirubin 0.2 - 1.2 mg/dL 0.6 0.9 1.0  Alkaline Phos 38 - 126 U/L - 41 58  AST 10 - 35 U/L '11 20 12  ' ALT 6 - 29 U/L '10 19 11    ' Lab Results  Component Value Date   WBC 6.0 11/28/2019   HGB 12.4 11/28/2019   HCT 37.9 11/28/2019   MCV 94.8 11/28/2019   PLT 263 11/28/2019   NEUTROABS 4.0 11/28/2019    ASSESSMENT & PLAN:  Malignant neoplasm of upper-outer quadrant of right breast in female, estrogen receptor positive ([place]) Screening detected right breast mass right breast biopsy 11 o'clock position: Invasive mammary cancer mixed histology, ER 95%, PR 95%, Ki-67 10%, HER-2 -1+ by IHC, Stage Ia Oncotype DX recurrence score: 10, risk of distant recurrence 3%, low risk Breast MRI 08/15/2018: Large area of non-mass enhancement 5.3 x 4.3 x 3.6 cm, medial aspect and another mass 1.8 cm and 2 adjacent satellite lesions total measuring 4 x 3 cm. Scattered 6 to 7 mm nodules lower portion right breast. Left breast 0.6 cm enhancing mass (biopsy intermediate grade LCIS)  Treatment plan: 1.Neoadjuvant antiestrogen therapy with tamoxifen started 08/19/2018 x 6 months 2.01/19/2019:Bilateral mastectomies with reconstruction: Right mastectomy: Residual invasive carcinoma, multifocal, 1.6 cm, 0/3 lymph nodes, margins negative, ER 95%, PR 95%, HER-2 -1+, Ki-67 10%, residual cancer burden 1.63, left mastectomy: LCIS 3.Followed by adjuvant antiestrogen therapy with tamoxifen x10 years started 02/01/2020/switched to anastrozole once she becomes menopausal. ------------------------------------------------------------------------------------------------------------------------------------------------------------------ Tamoxifen toxicities: 1.  Mild hot flashes: Seem to wake her once or twice at night.  They are not severe enough to  warrant any additional treatments.  She will try to switch the time of the day she takes tamoxifen and see if it makes a difference. Denies any mood swings or muscle cramps or any other side effects.  Today we drew labs for [facility] and estradiol.  Her last menstrual cycle was sometime in January.  I do not believe she will be in menopause.  We will recheck her next year.  If she is menopausal we can consider switching her to anastrozole.  Breast cancer surveillance: 1.  Breast exam 11/28/2019: Benign 2.  No role of imaging studies since she had bilateral mastectomies.  Return to clinic in 1 year for follow-up   No orders of the defined types were placed in this encounter.  The patient has a good understanding of the overall plan. she agrees with it. she will call with any problems that may develop before the next visit here.  Total time spent: 20 mins including face to face time and time spent for planning, charting and coordination of care  Nicholas Lose, MD 11/28/2019  I, Cloyde Reams Dorshimer, am acting as scribe for Dr. Nicholas Lose.  I have reviewed the above documentation for accuracy and completeness, and I agree with the above.

## 2019-11-28 ENCOUNTER — Inpatient Hospital Stay: Payer: BC Managed Care – PPO

## 2019-11-28 ENCOUNTER — Other Ambulatory Visit: Payer: Self-pay

## 2019-11-28 ENCOUNTER — Inpatient Hospital Stay: Payer: BC Managed Care – PPO | Attending: Hematology and Oncology | Admitting: Hematology and Oncology

## 2019-11-28 DIAGNOSIS — Z9013 Acquired absence of bilateral breasts and nipples: Secondary | ICD-10-CM | POA: Insufficient documentation

## 2019-11-28 DIAGNOSIS — Z79810 Long term (current) use of selective estrogen receptor modulators (SERMs): Secondary | ICD-10-CM | POA: Insufficient documentation

## 2019-11-28 DIAGNOSIS — C50411 Malignant neoplasm of upper-outer quadrant of right female breast: Secondary | ICD-10-CM | POA: Diagnosis not present

## 2019-11-28 DIAGNOSIS — Z170 Estrogen receptor positive status [ER+]: Secondary | ICD-10-CM

## 2019-11-28 DIAGNOSIS — Z79899 Other long term (current) drug therapy: Secondary | ICD-10-CM | POA: Insufficient documentation

## 2019-11-28 LAB — CMP (CANCER CENTER ONLY)
ALT: 14 U/L (ref 0–44)
AST: 15 U/L (ref 15–41)
Albumin: 3.6 g/dL (ref 3.5–5.0)
Alkaline Phosphatase: 55 U/L (ref 38–126)
Anion gap: 9 (ref 5–15)
BUN: 12 mg/dL (ref 6–20)
CO2: 26 mmol/L (ref 22–32)
Calcium: 8.9 mg/dL (ref 8.9–10.3)
Chloride: 107 mmol/L (ref 98–111)
Creatinine: 0.7 mg/dL (ref 0.44–1.00)
GFR, Est AFR Am: >60 mL/min (ref >60)
GFR, Estimated: >60 mL/min (ref >60)
Glucose, Bld: 92 mg/dL (ref 70–99)
Potassium: 3.6 mmol/L (ref 3.5–5.1)
Sodium: 142 mmol/L (ref 135–145)
Total Bilirubin: 0.7 mg/dL (ref 0.3–1.2)
Total Protein: 6.8 g/dL (ref 6.5–8.1)

## 2019-11-28 LAB — CBC WITH DIFFERENTIAL (CANCER CENTER ONLY)
Abs Immature Granulocytes: 0.02 10*3/uL (ref 0.00–0.07)
Basophils Absolute: 0 10*3/uL (ref 0.0–0.1)
Basophils Relative: 1 %
Eosinophils Absolute: 0 10*3/uL (ref 0.0–0.5)
Eosinophils Relative: 1 %
HCT: 37.9 % (ref 36.0–46.0)
Hemoglobin: 12.4 g/dL (ref 12.0–15.0)
Immature Granulocytes: 0 %
Lymphocytes Relative: 24 %
Lymphs Abs: 1.5 10*3/uL (ref 0.7–4.0)
MCH: 31 pg (ref 26.0–34.0)
MCHC: 32.7 g/dL (ref 30.0–36.0)
MCV: 94.8 fL (ref 80.0–100.0)
Monocytes Absolute: 0.4 10*3/uL (ref 0.1–1.0)
Monocytes Relative: 7 %
Neutro Abs: 4 10*3/uL (ref 1.7–7.7)
Neutrophils Relative %: 67 %
Platelet Count: 263 10*3/uL (ref 150–400)
RBC: 4 MIL/uL (ref 3.87–5.11)
RDW: 12 % (ref 11.5–15.5)
WBC Count: 6 10*3/uL (ref 4.0–10.5)
nRBC: 0 % (ref 0.0–0.2)

## 2019-11-28 MED ORDER — TAMOXIFEN CITRATE 20 MG PO TABS: 20.0000 mg | ORAL_TABLET | Freq: Every day | ORAL | 3 refills | Status: DC

## 2019-11-28 NOTE — Assessment & Plan Note (Signed)
Screening detected right breast mass right breast biopsy 11 o'clock position: Invasive mammary cancer mixed histology, ER 95%, PR 95%, Ki-67 10%, HER-2 -1+ by IHC, Stage Ia Oncotype DX recurrence score: 10, risk of distant recurrence 3%, low risk Breast MRI 08/15/2018: Large area of non-mass enhancement 5.3 x 4.3 x 3.6 cm, medial aspect and another mass 1.8 cm and 2 adjacent satellite lesions total measuring 4 x 3 cm. Scattered 6 to 7 mm nodules lower portion right breast. Left breast 0.6 cm enhancing mass (biopsy intermediate grade LCIS)  Treatment plan: 1.Neoadjuvant antiestrogen therapy with tamoxifen started 08/19/2018 x 6 months 2.01/19/2019:Bilateral mastectomies with reconstruction: Right mastectomy: Residual invasive carcinoma, multifocal, 1.6 cm, 0/3 lymph nodes, margins negative, ER 95%, PR 95%, HER-2 -1+, Ki-67 10%, residual cancer burden 1.63, left mastectomy: LCIS 3.Followed by adjuvant antiestrogen therapy with tamoxifen x10 years started 02/01/2020 ------------------------------------------------------------------------------------------------------------------------------------------------------------------ Tamoxifen toxicities:  Breast cancer surveillance: 1.  Breast exam 11/28/2019: Benign 2.  No role of imaging studies since she had bilateral mastectomies.

## 2019-11-29 LAB — LUTEINIZING HORMONE: LH: 26.6 m[IU]/mL

## 2019-11-29 LAB — FOLLICLE STIMULATING HORMONE: FSH: 38.1 m[IU]/mL

## 2019-12-02 LAB — ESTRADIOL, ULTRA SENS: Estradiol, Sensitive: 219 pg/mL

## 2019-12-04 ENCOUNTER — Telehealth: Payer: Self-pay

## 2019-12-04 NOTE — Telephone Encounter (Signed)
-----   Message from Gardenia Phlegm, NP sent at 12/03/2019  9:57 AM EDT ----- Please call patient.  Labs confirm she is NOT in menopause.  Thanks, ----- Message ----- From: Buel Ream, Lab In [place] Sent: 11/28/2019   8:29 AM EDT To: Gardenia Phlegm, NP

## 2019-12-04 NOTE — Telephone Encounter (Signed)
RN notified of lab results, pt verbalized understanding.  No further needs at this time.

## 2020-01-04 DIAGNOSIS — Z853 Personal history of malignant neoplasm of breast: Secondary | ICD-10-CM | POA: Diagnosis not present

## 2020-01-04 DIAGNOSIS — Z9013 Acquired absence of bilateral breasts and nipples: Secondary | ICD-10-CM | POA: Diagnosis not present

## 2020-01-10 ENCOUNTER — Other Ambulatory Visit: Payer: Self-pay

## 2020-01-10 ENCOUNTER — Ambulatory Visit (AMBULATORY_SURGERY_CENTER): Payer: Self-pay | Admitting: *Deleted

## 2020-01-10 VITALS — Ht 66.0 in | Wt 142.0 lb

## 2020-01-10 DIAGNOSIS — Z1211 Encounter for screening for malignant neoplasm of colon: Secondary | ICD-10-CM

## 2020-01-10 MED ORDER — SUTAB 1479-225-188 MG PO TABS: 24.0000 | ORAL_TABLET | ORAL | 0 refills | Status: DC

## 2020-01-10 NOTE — Progress Notes (Signed)
No egg or soy allergy known to patient  No issues with past sedation with any surgeries  or procedures, no intubation problems  No diet pills per patient No home 02 use per patient  No blood thinners per patient  Pt denies issues with constipation  No A fib or A flutter  EMMI video sent to pt's e mail   11-07-2019 comp covid vacc x 2   Due to the COVID-19 pandemic we are asking patients to follow these guidelines. Please only bring one care partner. Please be aware that your care partner may wait in the car in the parking lot or if they feel like they will be too hot to wait in the car, they may wait in the lobby on the 4th floor. All care partners are required to wear a mask the entire time (we do not have any that we can provide them), they need to practice social distancing, and we will do a Covid check for all patient's and care partners when you arrive. Also we will check their temperature and your temperature. If the care partner waits in their car they need to stay in the parking lot the entire time and we will call them on their cell phone when the patient is ready for discharge so they can bring the car to the front of the building. Also all patient's will need to wear a mask into building.

## 2020-01-17 ENCOUNTER — Encounter: Payer: Self-pay | Admitting: Gastroenterology

## 2020-01-24 ENCOUNTER — Ambulatory Visit (AMBULATORY_SURGERY_CENTER): Payer: BC Managed Care – PPO | Admitting: Gastroenterology

## 2020-01-24 ENCOUNTER — Other Ambulatory Visit: Payer: Self-pay

## 2020-01-24 ENCOUNTER — Encounter: Payer: Self-pay | Admitting: Gastroenterology

## 2020-01-24 VITALS — BP 122/47 | HR 53 | Temp 98.4°F | Resp 17 | Ht 66.0 in | Wt 142.0 lb

## 2020-01-24 DIAGNOSIS — Z800 Family history of malignant neoplasm of digestive organs: Secondary | ICD-10-CM

## 2020-01-24 DIAGNOSIS — Z1211 Encounter for screening for malignant neoplasm of colon: Secondary | ICD-10-CM

## 2020-01-24 MED ORDER — SODIUM CHLORIDE 0.9 % IV SOLN: 500.0000 mL | Freq: Once | INTRAVENOUS | Status: DC

## 2020-01-24 NOTE — Progress Notes (Signed)
A/ox3, pleased with MAC, report to RN 

## 2020-01-24 NOTE — Patient Instructions (Signed)
Repeat colonoscopy in 1 year due to poor bowel prep.  Plan a 2 day bowel prep at that time.  Resume previous diet and medications.  YOU HAD AN ENDOSCOPIC PROCEDURE TODAY AT [facility]:   Refer to the procedure report that was given to you for any specific questions about what was found during the examination.  If the procedure report does not answer your questions, please call your gastroenterologist to clarify.  If you requested that your care partner not be given the details of your procedure findings, then the procedure report has been included in a sealed envelope for you to review at your convenience later.  YOU SHOULD EXPECT: Some feelings of bloating in the abdomen. Passage of more gas than usual.  Walking can help get rid of the air that was put into your GI tract during the procedure and reduce the bloating. If you had a lower endoscopy (such as a colonoscopy or flexible sigmoidoscopy) you may notice spotting of blood in your stool or on the toilet paper. If you underwent a bowel prep for your procedure, you may not have a normal bowel movement for a few days.  Please Note:  You might notice some irritation and congestion in your nose or some drainage.  This is from the oxygen used during your procedure.  There is no need for concern and it should clear up in a day or so.  SYMPTOMS TO REPORT IMMEDIATELY:   Following lower endoscopy (colonoscopy or flexible sigmoidoscopy):  Excessive amounts of blood in the stool  Significant tenderness or worsening of abdominal pains  Swelling of the abdomen that is new, acute  Fever of 100F or higher  For urgent or emergent issues, a gastroenterologist can be reached at any hour by calling [phone]. Do not use MyChart messaging for urgent concerns.    DIET:  We do recommend a small meal at first, but then you may proceed to your regular diet.  Drink plenty of fluids but you should avoid alcoholic beverages for 24  hours.  ACTIVITY:  You should plan to take it easy for the rest of today and you should NOT DRIVE or use heavy machinery until tomorrow (because of the sedation medicines used during the test).    FOLLOW UP: Our staff will call the number listed on your records 48-72 hours following your procedure to check on you and address any questions or concerns that you may have regarding the information given to you following your procedure. If we do not reach you, we will leave a message.  We will attempt to reach you two times.  During this call, we will ask if you have developed any symptoms of COVID 19. If you develop any symptoms (ie: fever, flu-like symptoms, shortness of breath, cough etc.) before then, please call [phone].  If you test positive for Covid 19 in the 2 weeks post procedure, please call and report this information to [place].    If any biopsies were taken you will be contacted by phone or by letter within the next 1-3 weeks.  Please call us at [phone] if you have not heard about the biopsies in 3 weeks.    SIGNATURES/CONFIDENTIALITY: You and/or your care partner have signed paperwork which will be entered into your electronic medical record.  These signatures attest to the fact that that the information above on your After Visit Summary has been reviewed and is understood.  Full responsibility of the confidentiality of this  discharge information lies with you and/or your care-partner. 

## 2020-01-24 NOTE — Op Note (Signed)
[facility] Patient Name: Kelli [name] Procedure Date: 01/24/2020 10:25 AM MRN: [id number] Endoscopist: Thornton Park MD, MD Age: 49 Referring MD:  Date of Birth: [birth year] Gender: Female Account #: [id number] Procedure:                Colonoscopy Indications:              Screening for colorectal malignant neoplasm. This                            is the patient's first colonoscopy.                           Mother with colon cancer at age 59. No other known                            family history of colon cancer or polyps. Medicines:                Monitored Anesthesia Care Procedure:                Pre-Anesthesia Assessment:                           - Prior to the procedure, a History and Physical                            was performed, and patient medications and                            allergies were reviewed. The patient's tolerance of                            previous anesthesia was also reviewed. The risks                            and benefits of the procedure and the sedation                            options and risks were discussed with the patient.                            All questions were answered, and informed consent                            was obtained. Prior Anticoagulants: The patient has                            taken no previous anticoagulant or antiplatelet                            agents. ASA Grade Assessment: II - A patient with                            mild systemic disease. After reviewing the risks  and benefits, the patient was deemed in                            satisfactory condition to undergo the procedure.                           After obtaining informed consent, the colonoscope                            was passed under direct vision. Throughout the                            procedure, the patient's blood pressure, pulse, and                            oxygen saturations were  monitored continuously. The                            Colonoscope was introduced through the anus and                            advanced to the 3 cm into the ileum. The                            colonoscopy was performed without difficulty. The                            patient tolerated the procedure well. The quality                            of the bowel preparation was inadequate. Scope In: 10:37:08 AM Scope Out: 10:51:46 AM Scope Withdrawal Time: 0 hours 4 minutes 27 seconds  Total Procedure Duration: 0 hours 14 minutes 38 seconds  Findings:                 The perianal and digital rectal examinations were                            normal.                           A moderate amount of liquid semi-liquid semi-solid                            stool was found in the entire colon and examined                            distal terminal ileum. This limited a meaningful                            evaluation for small and medium sized polyps.                            Estimated blood loss: none.  The exam was otherwise without abnormality on                            direct and retroflexion views. Complications:            No immediate complications. Estimated Blood Loss:     Estimated blood loss: none. Impression:               - Preparation of the colon was inadequate. Stool                            present throughout the colon and in the examined                            terminal ileu.                           - No specimens collected. Recommendation:           - Patient has a contact number available for                            emergencies. The signs and symptoms of potential                            delayed complications were discussed with the                            patient. Return to normal activities tomorrow.                            Written discharge instructions were provided to the                            patient.                            - Resume previous diet today.                           - Continue present medications.                           - Repeat colonoscopy in 1 year because the bowel                            preparation was poor. Plan two day bowel prep at                            that time.                           - Emerging evidence supports eating a diet of                            fruits, vegetables, grains, calcium, and yogurt  while reducing red meat and alcohol may reduce the                            risk of colon cancer.                           - Thank you for allowing me to be involved in your                            colon cancer prevention. Thornton Park MD, MD 01/24/2020 10:58:13 AM This report has been signed electronically.

## 2020-01-24 NOTE — Progress Notes (Signed)
Pt's states no medical or surgical changes since previsit or office visit. 

## 2020-01-26 ENCOUNTER — Telehealth: Payer: Self-pay

## 2020-01-26 ENCOUNTER — Telehealth: Payer: Self-pay | Admitting: *Deleted

## 2020-01-26 NOTE — Telephone Encounter (Signed)
Follow up call made. Left message

## 2020-01-26 NOTE — Telephone Encounter (Signed)
Covid-19 screening questions   Do you now or have you had a fever in the last 14 days? No.  Do you have any respiratory symptoms of shortness of breath or cough now or in the last 14 days? No.  Do you have any family members or close contacts with diagnosed or suspected Covid-19 in the past 14 days? No.  Have you been tested for Covid-19 and found to be positive? No.       Follow up Call-  Call back number 01/24/2020  Post procedure Call Back phone  # [phone]  Permission to leave phone message Yes  Some recent data might be hidden     Patient questions:  Do you have a fever, pain , or abdominal swelling? No. Pain Score  0 *  Have you tolerated food without any problems? Yes.    Have you been able to return to your normal activities? Yes.    Do you have any questions about your discharge instructions: Diet   No. Medications  No. Follow up visit  No.  Do you have questions or concerns about your Care? No.  Actions: * If pain score is 4 or above: No action needed, pain <4.

## 2020-02-16 DIAGNOSIS — Z20822 Contact with and (suspected) exposure to covid-19: Secondary | ICD-10-CM | POA: Diagnosis not present

## 2020-04-01 ENCOUNTER — Other Ambulatory Visit: Payer: Self-pay | Admitting: *Deleted

## 2020-04-01 MED ORDER — TAMOXIFEN CITRATE 20 MG PO TABS: 20.0000 mg | ORAL_TABLET | Freq: Every day | ORAL | 3 refills | Status: DC

## 2020-11-25 ENCOUNTER — Other Ambulatory Visit: Payer: Self-pay | Admitting: *Deleted

## 2020-11-25 DIAGNOSIS — Z170 Estrogen receptor positive status [ER+]: Secondary | ICD-10-CM

## 2020-11-26 ENCOUNTER — Other Ambulatory Visit: Payer: Self-pay

## 2020-11-26 ENCOUNTER — Inpatient Hospital Stay: Payer: 59 | Attending: Hematology and Oncology

## 2020-11-26 DIAGNOSIS — C50411 Malignant neoplasm of upper-outer quadrant of right female breast: Secondary | ICD-10-CM | POA: Insufficient documentation

## 2020-11-26 DIAGNOSIS — Z170 Estrogen receptor positive status [ER+]: Secondary | ICD-10-CM | POA: Insufficient documentation

## 2020-11-26 LAB — CBC WITH DIFFERENTIAL (CANCER CENTER ONLY)
Abs Immature Granulocytes: 0.03 10*3/uL (ref 0.00–0.07)
Basophils Absolute: 0 10*3/uL (ref 0.0–0.1)
Basophils Relative: 1 %
Eosinophils Absolute: 0 10*3/uL (ref 0.0–0.5)
Eosinophils Relative: 0 %
HCT: 34.7 % — ABNORMAL LOW (ref 36.0–46.0)
Hemoglobin: 12.1 g/dL (ref 12.0–15.0)
Immature Granulocytes: 1 %
Lymphocytes Relative: 23 %
Lymphs Abs: 1.3 10*3/uL (ref 0.7–4.0)
MCH: 32.4 pg (ref 26.0–34.0)
MCHC: 34.9 g/dL (ref 30.0–36.0)
MCV: 92.8 fL (ref 80.0–100.0)
Monocytes Absolute: 0.5 10*3/uL (ref 0.1–1.0)
Monocytes Relative: 9 %
Neutro Abs: 3.8 10*3/uL (ref 1.7–7.7)
Neutrophils Relative %: 66 %
Platelet Count: 233 10*3/uL (ref 150–400)
RBC: 3.74 MIL/uL — ABNORMAL LOW (ref 3.87–5.11)
RDW: 12.6 % (ref 11.5–15.5)
WBC Count: 5.7 10*3/uL (ref 4.0–10.5)
nRBC: 0 % (ref 0.0–0.2)

## 2020-11-26 LAB — CMP (CANCER CENTER ONLY)
ALT: 9 U/L (ref 0–44)
AST: 16 U/L (ref 15–41)
Albumin: 3.7 g/dL (ref 3.5–5.0)
Alkaline Phosphatase: 56 U/L (ref 38–126)
Anion gap: 7 (ref 5–15)
BUN: 14 mg/dL (ref 6–20)
CO2: 26 mmol/L (ref 22–32)
Calcium: 8.6 mg/dL — ABNORMAL LOW (ref 8.9–10.3)
Chloride: 108 mmol/L (ref 98–111)
Creatinine: 0.66 mg/dL (ref 0.44–1.00)
GFR, Estimated: >60 mL/min (ref >60)
Glucose, Bld: 94 mg/dL (ref 70–99)
Potassium: 3.9 mmol/L (ref 3.5–5.1)
Sodium: 141 mmol/L (ref 135–145)
Total Bilirubin: 0.9 mg/dL (ref 0.3–1.2)
Total Protein: 6.7 g/dL (ref 6.5–8.1)

## 2020-11-27 LAB — FOLLICLE STIMULATING HORMONE: FSH: 20.5 m[IU]/mL

## 2020-12-09 LAB — ESTRADIOL, ULTRA SENS: Estradiol, Sensitive: 359.1 pg/mL

## 2020-12-12 NOTE — Assessment & Plan Note (Signed)
Screening detected right breast mass right breast biopsy 11 o'clock position: Invasive mammary cancer mixed histology, ER 95%, PR 95%, Ki-67 10%, HER-2 -1+ by IHC, Stage Ia Oncotype DX recurrence score: 10, risk of distant recurrence 3%, low risk Breast MRI 08/15/2018: Large area of non-mass enhancement 5.3 x 4.3 x 3.6 cm, medial aspect and another mass 1.8 cm and 2 adjacent satellite lesions total measuring 4 x 3 cm. Scattered 6 to 7 mm nodules lower portion right breast. Left breast 0.6 cm enhancing mass (biopsy intermediate grade LCIS)  Treatment plan: 1.Neoadjuvant antiestrogen therapy with tamoxifen started 08/19/2018 x 6 months 2.01/19/2019:Bilateral mastectomies with reconstruction: Right mastectomy: Residual invasive carcinoma, multifocal, 1.6 cm, 0/3 lymph nodes, margins negative, ER 95%, PR 95%, HER-2 -1+, Ki-67 10%, residual cancer burden 1.63, left mastectomy: LCIS 3.Followed by adjuvant antiestrogen therapy with tamoxifen x10 years started 02/01/2020/switched to anastrozole once she becomes menopausal. ------------------------------------------------------------------------------------------------------------------------------------------------------------------ Tamoxifen toxicities: 1.  Mild hot flashes: Seem to wake her once or twice at night.  They are not severe enough to warrant any additional treatments.  She will try to switch the time of the day she takes tamoxifen and see if it makes a difference. Denies any mood swings or muscle cramps or any other side effects.  Today we drew labs for FSH and estradiol.  Her last menstrual cycle was sometime in January.  I do not believe she will be in menopause.  We will recheck her next year.  If she is menopausal we can consider switching her to anastrozole.  Breast cancer surveillance: 1.  Breast exam 12/13/20: Benign 2.  No role of imaging studies since she had bilateral mastectomies.  Return to clinic in 1 year for follow-up 

## 2020-12-12 NOTE — Progress Notes (Signed)
Patient Care Team: Leamon Arnt, MD as PCP - General (Family Medicine) Eppie Gibson, MD as Attending Physician (Radiation Oncology) Irene Limbo, MD as Consulting Physician (Plastic Surgery) Nicholas Lose, MD as Consulting Physician (Hematology and Oncology) Erroll Luna, MD as Consulting Physician (General Surgery) Marylynn Pearson, MD as Consulting Physician (Obstetrics and Gynecology)  DIAGNOSIS:    ICD-10-CM   1. Malignant neoplasm of upper-outer quadrant of right breast in female, estrogen receptor positive ([place])  C50.411    Z17.0     SUMMARY OF ONCOLOGIC HISTORY: Oncology History  Malignant neoplasm of upper-outer quadrant of right breast in female, estrogen receptor positive ([place])  07/22/2018 Initial Diagnosis   Screening detected right breast mass right breast biopsy 11 o'clock position: Invasive mammary cancer mixed histology, ER 95%, PR 95%, Ki-67 10%, HER-2 -1+ by IHC,   08/14/2018 Breast MRI   Right breast upper central portion non-mass enhancement 5.3 cm, along the medial aspect discrete mass 1.8 cm, 2 adjacent satellite nodules together make 4 cm.  Scattered 6 to 7 mm enhancing nodules lower portion right breast, left breast 0.6 cm enhancing mass    08/19/2018 Cancer Staging   Staging form: Breast, AJCC 8th Edition - Clinical: Stage IA (cT1c, cN0, cM0, G2, ER+, PR+, HER2-) - Signed by Eppie Gibson, MD on 08/19/2018   08/2018 -  Anti-estrogen oral therapy   Tamoxifen daily; plan for 10 years of therapy   01/19/2019 Surgery   Bilateral mastectomies with reconstruction ([facility]): Right mastectomy: Residual invasive carcinoma, multifocal, 1.6 cm, 0/3 lymph nodes, margins negative, ER 95%, PR 95%, HER-2 -1+, Ki-67 10%, residual cancer burden 1.63, left mastectomy: LCIS   01/19/2019 Cancer Staging   Staging form: Breast, AJCC 8th Edition - Pathologic stage from 01/19/2019: No Stage Recommended (ypT1c, pN0, cM0, G2, ER+, PR+, HER2-) - Signed by  Gardenia Phlegm, NP on 02/08/2019     CHIEF COMPLIANT: Follow-up of right breast cancer on tamoxifen  INTERVAL HISTORY: Kelli [name] is a 50 y.o. with above-mentioned history of right breast cancer treated with neoadjuvant tamoxifen therapy, bilateral mastectomies with reconstruction, and is currently on antiestrogen therapy with tamoxifen. She presents to the clinic today for follow-up.  She reports that the hot flashes are much better since she started taking tamoxifen in the morning.  She is able to handle the side effects fairly well.  Denies any pain or discomfort in the bilateral chest wall or axilla of the reconstructed breast.  ALLERGIES:  is allergic to ciprofloxacin, doxycycline, and sulfa antibiotics.  MEDICATIONS:  Current Outpatient Medications  Medication Sig Dispense Refill  . cholecalciferol (VITAMIN D3) 25 MCG (1000 UNIT) tablet Take 2,000 Units by mouth daily.    Marland Kitchen ibuprofen (ADVIL) 200 MG tablet Take 200 mg by mouth every 6 (six) hours as needed.    . Multiple Vitamins-Minerals (EYE VITAMINS PO) Take by mouth. Ocuvite    . tamoxifen (NOLVADEX) 20 MG tablet Take 1 tablet (20 mg total) by mouth daily. May resume 2 weeks after surgery 90 tablet 3  . triamcinolone cream (KENALOG) 0.1 % Apply 1 application topically 2 (two) times daily. For 1 week, then as needed (Patient not taking: Reported on 01/24/2020) 45 g 0   No current facility-administered medications for this visit.    PHYSICAL EXAMINATION: ECOG PERFORMANCE STATUS: 1 - Symptomatic but completely ambulatory  Vitals:   12/13/20 1120  BP: (!) 113/41  Pulse: 68  Resp: 18  Temp: 97.9 F (36.6 C)  SpO2: 100%  Filed Weights   12/13/20 1120  Weight: 142 lb 1.6 oz (64.5 kg)    BREAST: No palpable masses or nodules in either right or left breasts. No palpable axillary supraclavicular or infraclavicular adenopathy no breast tenderness or nipple discharge. (exam performed in the presence of a  chaperone)  LABORATORY DATA:  I have reviewed the data as listed CMP Latest Ref Rng & Units 11/26/2020 11/28/2019 09/08/2019  Glucose 70 - 99 mg/dL 94 92 107(H)  BUN 6 - 20 mg/dL 14 12 10  Creatinine 0.44 - 1.00 mg/dL 0.66 0.70 0.58  Sodium 135 - 145 mmol/L 141 142 141  Potassium 3.5 - 5.1 mmol/L 3.9 3.6 3.9  Chloride 98 - 111 mmol/L 108 107 107  CO2 22 - 32 mmol/L 26 26 25  Calcium 8.9 - 10.3 mg/dL 8.6(L) 8.9 9.2  Total Protein 6.5 - 8.1 g/dL 6.7 6.8 6.6  Total Bilirubin 0.3 - 1.2 mg/dL 0.9 0.7 0.6  Alkaline Phos 38 - 126 U/L 56 55 -  AST 15 - 41 U/L 16 15 11  ALT 0 - 44 U/L 9 14 10    Lab Results  Component Value Date   WBC 5.7 11/26/2020   HGB 12.1 11/26/2020   HCT 34.7 (L) 11/26/2020   MCV 92.8 11/26/2020   PLT 233 11/26/2020   NEUTROABS 3.8 11/26/2020    ASSESSMENT & PLAN:  Malignant neoplasm of upper-outer quadrant of right breast in female, estrogen receptor positive ([facility]) Screening detected right breast mass right breast biopsy 11 o'clock position: Invasive mammary cancer mixed histology, ER 95%, PR 95%, Ki-67 10%, HER-2 -1+ by IHC, Stage Ia Oncotype DX recurrence score: 10, risk of distant recurrence 3%, low risk Breast MRI 08/15/2018: Large area of non-mass enhancement 5.3 x 4.3 x 3.6 cm, medial aspect and another mass 1.8 cm and 2 adjacent satellite lesions total measuring 4 x 3 cm. Scattered 6 to 7 mm nodules lower portion right breast. Left breast 0.6 cm enhancing mass (biopsy intermediate grade LCIS)  Treatment plan: 1.Neoadjuvant antiestrogen therapy with tamoxifen started 08/19/2018 x 6 months 2.01/19/2019:Bilateral mastectomies with reconstruction: Right mastectomy: Residual invasive carcinoma, multifocal, 1.6 cm, 0/3 lymph nodes, margins negative, ER 95%, PR 95%, HER-2 -1+, Ki-67 10%, residual cancer burden 1.63, left mastectomy: LCIS 3.Followed by adjuvant antiestrogen therapy with tamoxifen x10 years started 02/01/2020/switched to anastrozole once she  becomes menopausal. ------------------------------------------------------------------------------------------------------------------------------------------------------------------ Tamoxifen toxicities: 1.  Mild hot flashes: Seem to wake her once or twice at night.  They are not severe enough to warrant any additional treatments.  She will try to switch the time of the day she takes tamoxifen and see if it makes a difference. Denies any mood swings or muscle cramps or any other side effects.  May 2022: FSH 20.5, estradiol 359   Breast cancer surveillance: 1.  Breast exam 12/13/20: Benign 2.  No role of imaging studies since she had bilateral mastectomies. We checked estradiol levels to see if she is in menopause.  The levels were extremely high and do not indicate menopause. We decided not to do any blood work next year.  Return to clinic in 1 year for follow-up    No orders of the defined types were placed in this encounter.  The patient has a good understanding of the overall plan. she agrees with it. she will call with any problems that may develop before the next visit here.  Total time spent: 20 mins including face to face time and time spent for planning, charting and   coordination of care  Vinay K Gudena, MD, MPH 12/13/2020  I, Molly Dorshimer, am acting as scribe for Dr. Vinay Gudena.  I have reviewed the above documentation for accuracy and completeness, and I agree with the above.       

## 2020-12-13 ENCOUNTER — Inpatient Hospital Stay: Payer: 59 | Attending: Hematology and Oncology | Admitting: Hematology and Oncology

## 2020-12-13 ENCOUNTER — Other Ambulatory Visit: Payer: Self-pay

## 2020-12-13 DIAGNOSIS — Z9013 Acquired absence of bilateral breasts and nipples: Secondary | ICD-10-CM | POA: Diagnosis not present

## 2020-12-13 DIAGNOSIS — C50411 Malignant neoplasm of upper-outer quadrant of right female breast: Secondary | ICD-10-CM | POA: Diagnosis not present

## 2020-12-13 DIAGNOSIS — Z170 Estrogen receptor positive status [ER+]: Secondary | ICD-10-CM

## 2020-12-13 DIAGNOSIS — Z79899 Other long term (current) drug therapy: Secondary | ICD-10-CM | POA: Diagnosis not present

## 2020-12-13 DIAGNOSIS — Z79810 Long term (current) use of selective estrogen receptor modulators (SERMs): Secondary | ICD-10-CM | POA: Diagnosis not present

## 2020-12-13 MED ORDER — TAMOXIFEN CITRATE 20 MG PO TABS: 20.0000 mg | ORAL_TABLET | Freq: Every day | ORAL | 3 refills | Status: DC

## 2021-04-28 ENCOUNTER — Encounter: Payer: Self-pay | Admitting: Gastroenterology

## 2021-05-23 ENCOUNTER — Ambulatory Visit (INDEPENDENT_AMBULATORY_CARE_PROVIDER_SITE_OTHER): Payer: 59 | Admitting: Family Medicine

## 2021-05-23 ENCOUNTER — Encounter: Payer: Self-pay | Admitting: Family Medicine

## 2021-05-23 ENCOUNTER — Other Ambulatory Visit: Payer: Self-pay

## 2021-05-23 VITALS — BP 122/80 | HR 121 | Temp 98.4°F | Ht 66.0 in | Wt 143.8 lb

## 2021-05-23 DIAGNOSIS — Z23 Encounter for immunization: Secondary | ICD-10-CM

## 2021-05-23 DIAGNOSIS — Z7185 Encounter for immunization safety counseling: Secondary | ICD-10-CM

## 2021-05-23 DIAGNOSIS — Z800 Family history of malignant neoplasm of digestive organs: Secondary | ICD-10-CM

## 2021-05-23 DIAGNOSIS — Z853 Personal history of malignant neoplasm of breast: Secondary | ICD-10-CM | POA: Diagnosis not present

## 2021-05-23 DIAGNOSIS — Z0000 Encounter for general adult medical examination without abnormal findings: Secondary | ICD-10-CM

## 2021-05-23 LAB — CBC WITH DIFFERENTIAL/PLATELET
Basophils Absolute: 0 10*3/uL (ref 0.0–0.1)
Basophils Relative: 0.6 % (ref 0.0–3.0)
Eosinophils Absolute: 0 10*3/uL (ref 0.0–0.7)
Eosinophils Relative: 0.3 % (ref 0.0–5.0)
HCT: 37.5 % (ref 36.0–46.0)
Hemoglobin: 12.6 g/dL (ref 12.0–15.0)
Lymphocytes Relative: 20.6 % (ref 12.0–46.0)
Lymphs Abs: 1.4 10*3/uL (ref 0.7–4.0)
MCHC: 33.6 g/dL (ref 30.0–36.0)
MCV: 94.2 fl (ref 78.0–100.0)
Monocytes Absolute: 0.4 10*3/uL (ref 0.1–1.0)
Monocytes Relative: 6.8 % (ref 3.0–12.0)
Neutro Abs: 4.7 10*3/uL (ref 1.4–7.7)
Neutrophils Relative %: 71.7 % (ref 43.0–77.0)
Platelets: 234 10*3/uL (ref 150.0–400.0)
RBC: 3.98 Mil/uL (ref 3.87–5.11)
RDW: 12.5 % (ref 11.5–15.5)
WBC: 6.6 10*3/uL (ref 4.0–10.5)

## 2021-05-23 LAB — LIPID PANEL
Cholesterol: 144 mg/dL (ref 0–200)
HDL: 58.2 mg/dL (ref >39.00)
LDL Cholesterol: 77 mg/dL (ref 0–99)
NonHDL: 85.87
Total CHOL/HDL Ratio: 2
Triglycerides: 46 mg/dL (ref 0.0–149.0)
VLDL: 9.2 mg/dL (ref 0.0–40.0)

## 2021-05-23 LAB — COMPREHENSIVE METABOLIC PANEL
ALT: 11 U/L (ref 0–35)
AST: 16 U/L (ref 0–37)
Albumin: 4.2 g/dL (ref 3.5–5.2)
Alkaline Phosphatase: 43 U/L (ref 39–117)
BUN: 14 mg/dL (ref 6–23)
CO2: 28 mEq/L (ref 19–32)
Calcium: 9.1 mg/dL (ref 8.4–10.5)
Chloride: 106 mEq/L (ref 96–112)
Creatinine, Ser: 0.67 mg/dL (ref 0.40–1.20)
GFR: 100.91 mL/min (ref >60.00)
Glucose, Bld: 84 mg/dL (ref 70–99)
Potassium: 4.2 mEq/L (ref 3.5–5.1)
Sodium: 140 mEq/L (ref 135–145)
Total Bilirubin: 0.8 mg/dL (ref 0.2–1.2)
Total Protein: 6.9 g/dL (ref 6.0–8.3)

## 2021-05-23 LAB — TSH: TSH: 1.25 u[IU]/mL (ref 0.35–5.50)

## 2021-05-23 NOTE — Patient Instructions (Signed)
Please return in 12 months for your annual complete physical; please come fasting.   I will release your lab results to you on your MyChart account with further instructions. Please reply with any questions.    Today you were given your Flu and 1st of 2 Shingrix vaccinations.  Please schedule a nurse visit in 2-6 months to receive your 2nd Shingrix vaccine.  Please call Dr. Tarri Glenn office to schedule your colonoscopy. Good luck with this one.   If you have any questions or concerns, please don't hesitate to send me a message via MyChart or call the office at [phone]. Thank you for visiting with [place] today! It's our pleasure caring for you.   Preventive Care 52-17 Years Old, Female Preventive care refers to lifestyle choices and visits with your health care provider that can promote health and wellness. This includes: A yearly physical exam. This is also called an annual wellness visit. Regular dental and eye exams. Immunizations. Screening for certain conditions. Healthy lifestyle choices, such as: Eating a healthy diet. Getting regular exercise. Not using drugs or products that contain nicotine and tobacco. Limiting alcohol use. What can I expect for my preventive care visit? Physical exam Your health care provider will check your: Height and weight. These may be used to calculate your BMI (body mass index). BMI is a measurement that tells if you are at a healthy weight. Heart rate and blood pressure. Body temperature. Skin for abnormal spots. Counseling Your health care provider may ask you questions about your: Past medical problems. Family's medical history. Alcohol, tobacco, and drug use. Emotional well-being. Home life and relationship well-being. Sexual activity. Diet, exercise, and sleep habits. Work and work Statistician. Access to firearms. Method of birth control. Menstrual cycle. Pregnancy history. What immunizations do I need? Vaccines are usually given at  various ages, according to a schedule. Your health care provider will recommend vaccines for you based on your age, medical history, and lifestyle or other factors, such as travel or where you work. What tests do I need? Blood tests Lipid and cholesterol levels. These may be checked every 5 years, or more often if you are over 28 years old. Hepatitis C test. Hepatitis B test. Screening Lung cancer screening. You may have this screening every year starting at age 72 if you have a 30-pack-year history of smoking and currently smoke or have quit within the past 15 years. Colorectal cancer screening. All adults should have this screening starting at age 15 and continuing until age 31. Your health care provider may recommend screening at age 61 if you are at increased risk. You will have tests every 1-10 years, depending on your results and the type of screening test. Diabetes screening. This is done by checking your blood sugar (glucose) after you have not eaten for a while (fasting). You may have this done every 1-3 years. Mammogram. This may be done every 1-2 years. Talk with your health care provider about when you should start having regular mammograms. This may depend on whether you have a family history of breast cancer. BRCA-related cancer screening. This may be done if you have a family history of breast, ovarian, tubal, or peritoneal cancers. Pelvic exam and Pap test. This may be done every 3 years starting at age 70. Starting at age 73, this may be done every 5 years if you have a Pap test in combination with an HPV test. Other tests STD (sexually transmitted disease) testing, if you are at risk. Bone density  scan. This is done to screen for osteoporosis. You may have this scan if you are at high risk for osteoporosis. Talk with your health care provider about your test results, treatment options, and if necessary, the need for more tests. Follow these instructions at home: Eating  and drinking  Eat a diet that includes fresh fruits and vegetables, whole grains, lean protein, and low-fat dairy products. Take vitamin and mineral supplements as recommended by your health care provider. Do not drink alcohol if: Your health care provider tells you not to drink. You are pregnant, may be pregnant, or are planning to become pregnant. If you drink alcohol: Limit how much you have to 0-1 drink a day. Be aware of how much alcohol is in your drink. In the [place] one drink equals one 12 oz bottle of beer (355 mL), one 5 oz glass of wine (148 mL), or one 1 oz glass of hard liquor (44 mL). Lifestyle Take daily care of your teeth and gums. Brush your teeth every morning and night with fluoride toothpaste. Floss one time each day. Stay active. Exercise for at least 30 minutes 5 or more days each week. Do not use any products that contain nicotine or tobacco, such as cigarettes, e-cigarettes, and chewing tobacco. If you need help quitting, ask your health care provider. Do not use drugs. If you are sexually active, practice safe sex. Use a condom or other form of protection to prevent STIs (sexually transmitted infections). If you do not wish to become pregnant, use a form of birth control. If you plan to become pregnant, see your health care provider for a prepregnancy visit. If told by your health care provider, take low-dose aspirin daily starting at age 61. Find healthy ways to cope with stress, such as: Meditation, yoga, or listening to music. Journaling. Talking to a trusted person. Spending time with friends and family. Safety Always wear your seat belt while driving or riding in a vehicle. Do not drive: If you have been drinking alcohol. Do not ride with someone who has been drinking. When you are tired or distracted. While texting. Wear a helmet and other protective equipment during sports activities. If you have firearms in your house, make sure you follow all gun safety  procedures. What's next? Visit your health care provider once a year for an annual wellness visit. Ask your health care provider how often you should have your eyes and teeth checked. Stay up to date on all vaccines. This information is not intended to replace advice given to you by your health care provider. Make sure you discuss any questions you have with your health care provider. Document Revised: 09/27/2020 Document Reviewed: 03/31/2018 Elsevier Patient Education  2022 [employer].

## 2021-05-23 NOTE — Addendum Note (Signed)
Addended by: Gean Birchwood on: 05/23/2021 10:42 AM   Modules accepted: Orders

## 2021-05-23 NOTE — Progress Notes (Signed)
Subjective  Chief Complaint  Patient presents with   Annual Exam    HPI: Kelli [name] is a 50 y.o. female who presents to [facility] today for a Female Wellness Visit. She also has the concerns and/or needs as listed above in the chief complaint. These will be addressed in addition to the Health Maintenance Visit.   Wellness Visit: annual visit with health maintenance review and exam without Pap  HM: no mammo w/ h/o bilateral mastectomy. Pap screen up to date and nl. CRC: had inadequate prep last year, Dr. Tarri Glenn. Overdue for repeat now. No GI sxs. + FH of colon cancer Eligible for shingrix and flu today   Assessment  1. Annual physical exam   2. History of right breast cancer   3. Family history of colon cancer   4. Vaccine counseling      Plan  Female Wellness Visit: Age appropriate Health Maintenance and Prevention measures were discussed with patient. Included topics are cancer screening recommendations, ways to keep healthy (see AVS) including dietary and exercise recommendations, regular eye and dental care, use of seat belts, and avoidance of moderate alcohol use and tobacco use. Pt to call GI to get scheduled for colonoscopy BMI: discussed patient's BMI and encouraged positive lifestyle modifications to help get to or maintain a target BMI. HM needs and immunizations were addressed and ordered. See below for orders. See HM and immunization section for updates.1st shingrix and flu vaccines given today. Routine labs and screening tests ordered including cmp, cbc and lipids where appropriate. Discussed recommendations regarding Vit D and calcium supplementation (see AVS)  Follow up: 12 mo for cpe  Orders Placed This Encounter  Procedures   CBC with Differential/Platelet   Comprehensive metabolic panel   Hepatitis C antibody   Lipid panel   TSH   No orders of the defined types were placed in this encounter.     Body mass index is  23.21 kg/m. Wt Readings from Last 3 Encounters:  05/23/21 143 lb 12.8 oz (65.2 kg)  12/13/20 142 lb 1.6 oz (64.5 kg)  01/24/20 142 lb (64.4 kg)     Patient Active Problem List   Diagnosis Date Noted   History of right breast cancer 01/19/2019    S/p bilateral mastectomy and with implants 2020 Had precancerous lesions in left breast as well. Failed conservative mgt    Malignant neoplasm of upper-outer quadrant of right breast in female, estrogen receptor positive ([place]) 08/18/2018   Family history of colon cancer    Family history of prostate cancer    [place] wool spots 05/17/2018   Health Maintenance  Topic Date Due   COVID-19 Vaccine (1) Never done   Hepatitis C Screening  Never done   Zoster Vaccines- Shingrix (1 of 2) Never done   COLONOSCOPY (Pts 45-38yrs Insurance coverage will need to be confirmed)  01/23/2021   INFLUENZA VACCINE  03/03/2021   PAP SMEAR-Modifier  05/26/2023   TETANUS/TDAP  05/27/2029   HIV Screening  Completed   HPV VACCINES  Aged Out   Pneumococcal Vaccine 54-2 Years old  Discontinued   Immunization History  Administered Date(s) Administered   Influenza-Unspecified 05/24/2018, 05/05/2019   Tdap 05/28/2019   We updated and reviewed the patient's past history in detail and it is documented below. Allergies: Patient is allergic to ciprofloxacin, doxycycline, and sulfa antibiotics. Past Medical History Patient  has a past medical history of Family history of colon cancer, Family history of  prostate cancer, Malignant neoplasm of upper-outer quadrant of right breast in female, estrogen receptor positive ([place]) (08/18/2018), and Migraines. Past Surgical History Patient  has a past surgical history that includes Breast biopsy (2004); Nipple sparing mastectomy with sentinel lymph node biopsy (Bilateral, 01/19/2019); Breast Reconstruction with placement of Tissue Expander and Alloderm (Bilateral, 01/19/2019); Removal of bilateral tissue expanders with placement  of bilateral breast implants (Bilateral, 04/21/2019); Liposuction with lipofilling (Bilateral, 04/21/2019); Wisdom tooth extraction; and Dental surgery. Family History: Patient family history includes Arthritis in her maternal grandmother and mother; Cancer - Colon (age of onset: 39) in her mother; Colon cancer (age of onset: 88) in her mother; Colon polyps in her mother; Diabetes in her paternal grandmother; Hearing loss in her father; Hypertension in her maternal grandfather and mother; Learning disabilities in her son; Prostate cancer in her maternal uncle. Social History:  Patient  reports that she has never smoked. She has never used smokeless tobacco. She reports current alcohol use. She reports that she does not use drugs.  Review of Systems: Constitutional: negative for fever or malaise Ophthalmic: negative for photophobia, double vision or loss of vision Cardiovascular: negative for chest pain, dyspnea on exertion, or new LE swelling Respiratory: negative for SOB or persistent cough Gastrointestinal: negative for abdominal pain, change in bowel habits or melena Genitourinary: negative for dysuria or gross hematuria, no abnormal uterine bleeding or disharge Musculoskeletal: negative for new gait disturbance or muscular weakness Integumentary: negative for new or persistent rashes, no breast lumps Neurological: negative for TIA or stroke symptoms Psychiatric: negative for SI or delusions Allergic/Immunologic: negative for hives  Patient Care Team    Relationship Specialty Notifications Start End  Leamon Arnt, MD PCP - General Family Medicine  05/17/18   Eppie Gibson, MD Attending Physician Radiation Oncology  04/21/19   Irene Limbo, MD Consulting Physician Plastic Surgery  04/21/19   Nicholas Lose, MD Consulting Physician Hematology and Oncology  04/21/19   Erroll Luna, MD Consulting Physician General Surgery  04/21/19   Marylynn Pearson, MD Consulting Physician Obstetrics  and Gynecology  09/08/19     Objective  Vitals: BP 122/80   Pulse (!) 121   Temp 98.4 F (36.9 C) (Temporal)   Ht 5\' 6"  (1.676 m)   Wt 143 lb 12.8 oz (65.2 kg)   SpO2 99%   BMI 23.21 kg/m  General:  Well developed, well nourished, no acute distress  Psych:  Alert and orientedx3,normal mood and affect HEENT:  Normocephalic, atraumatic, non-icteric sclera,  supple neck without adenopathy, mass or thyromegaly Cardiovascular:  Normal S1, S2, RRR without gallop, rub or murmur Respiratory:  Good breath sounds bilaterally, CTAB with normal respiratory effort Gastrointestinal: normal bowel sounds, soft, non-tender, no noted masses. No HSM MSK: no deformities, contusions. Joints are without erythema or swelling.  Skin:  Warm, no rashes or suspicious lesions noted Neurologic:    Mental status is normal. Gross motor and sensory exams are normal. Normal gait. No tremor   Commons side effects, risks, benefits, and alternatives for medications and treatment plan prescribed today were discussed, and the patient expressed understanding of the given instructions. Patient is instructed to call or message via MyChart if he/she has any questions or concerns regarding our treatment plan. No barriers to understanding were identified. We discussed Red Flag symptoms and signs in detail. Patient expressed understanding regarding what to do in case of urgent or emergency type symptoms.  Medication list was reconciled, printed and provided to the patient in AVS. Patient instructions  and summary information was reviewed with the patient as documented in the AVS. This note was prepared with assistance of Dragon voice recognition software. Occasional wrong-word or sound-a-like substitutions may have occurred due to the inherent limitations of voice recognition software  This visit occurred during the SARS-CoV-2 public health emergency.  Safety protocols were in place, including screening questions prior to the visit,  additional usage of staff PPE, and extensive cleaning of exam room while observing appropriate contact time as indicated for disinfecting solutions.

## 2021-05-26 LAB — HEPATITIS C ANTIBODY
Hepatitis C Ab: NONREACTIVE
SIGNAL TO CUT-OFF: 0.1 (ref <1.00)

## 2021-06-17 ENCOUNTER — Encounter: Payer: Self-pay | Admitting: Gastroenterology

## 2021-07-24 ENCOUNTER — Other Ambulatory Visit: Payer: Self-pay

## 2021-07-24 ENCOUNTER — Ambulatory Visit (AMBULATORY_SURGERY_CENTER): Payer: 59

## 2021-07-24 VITALS — Ht 66.0 in | Wt 145.0 lb

## 2021-07-24 DIAGNOSIS — Z1211 Encounter for screening for malignant neoplasm of colon: Secondary | ICD-10-CM

## 2021-07-24 MED ORDER — NA SULFATE-K SULFATE-MG SULF 17.5-3.13-1.6 GM/177ML PO SOLN: 1.0000 | Freq: Once | ORAL | 0 refills | Status: AC

## 2021-07-24 NOTE — Progress Notes (Signed)
° °  Patient's pre-visit was done today over the phone with the patient   Name,DOB and address verified.   Patient denies any allergies to Eggs and Soy.  Patient denies any problems with anesthesia/sedation. Patient denies taking diet pills or blood thinners.  Denies atrial flutter or atrial fib Denies chronic constipation No home Oxygen.   Packet of Prep instructions mailed to patient including a copy of a consent form-pt is aware.  Patient understands to call us back with any questions or concerns.  Patient is aware of our care-partner policy and [id number] safety protocol.   EMMI education assigned to the patient for the procedure, sent to [place].      Pt noted she vomited on last procedure.  Discussed given of Zofran, but she declined.  Noted if she changes her mind she will call back.

## 2021-08-07 ENCOUNTER — Encounter: Payer: Self-pay | Admitting: Gastroenterology

## 2021-08-08 ENCOUNTER — Telehealth: Payer: Self-pay | Admitting: Gastroenterology

## 2021-08-08 NOTE — Telephone Encounter (Signed)
Returned pt call. Prep instructions reviewed. No further questions or concerns voiced.

## 2021-08-08 NOTE — Telephone Encounter (Signed)
Patient has some questions and concerns about prep. Please advise.

## 2021-08-11 ENCOUNTER — Encounter: Payer: Self-pay | Admitting: Certified Registered Nurse Anesthetist

## 2021-08-12 ENCOUNTER — Encounter: Payer: Self-pay | Admitting: Gastroenterology

## 2021-08-12 ENCOUNTER — Ambulatory Visit (AMBULATORY_SURGERY_CENTER): Payer: 59 | Admitting: Gastroenterology

## 2021-08-12 ENCOUNTER — Other Ambulatory Visit: Payer: Self-pay

## 2021-08-12 VITALS — BP 111/74 | HR 54 | Temp 98.9°F | Resp 12 | Ht 66.0 in | Wt 145.0 lb

## 2021-08-12 DIAGNOSIS — Z1211 Encounter for screening for malignant neoplasm of colon: Secondary | ICD-10-CM

## 2021-08-12 DIAGNOSIS — Z800 Family history of malignant neoplasm of digestive organs: Secondary | ICD-10-CM | POA: Diagnosis not present

## 2021-08-12 MED ORDER — SODIUM CHLORIDE 0.9 % IV SOLN: 500.0000 mL | Freq: Once | INTRAVENOUS | Status: DC

## 2021-08-12 NOTE — Progress Notes (Signed)
Report given to PACU, vss 

## 2021-08-12 NOTE — Patient Instructions (Signed)
Please read handouts provided. Continue present medications.   YOU HAD AN ENDOSCOPIC PROCEDURE TODAY AT THE [facility]:   Refer to the procedure report that was given to you for any specific questions about what was found during the examination.  If the procedure report does not answer your questions, please call your gastroenterologist to clarify.  If you requested that your care partner not be given the details of your procedure findings, then the procedure report has been included in a sealed envelope for you to review at your convenience later.  YOU SHOULD EXPECT: Some feelings of bloating in the abdomen. Passage of more gas than usual.  Walking can help get rid of the air that was put into your GI tract during the procedure and reduce the bloating. If you had a lower endoscopy (such as a colonoscopy or flexible sigmoidoscopy) you may notice spotting of blood in your stool or on the toilet paper. If you underwent a bowel prep for your procedure, you may not have a normal bowel movement for a few days.  Please Note:  You might notice some irritation and congestion in your nose or some drainage.  This is from the oxygen used during your procedure.  There is no need for concern and it should clear up in a day or so.  SYMPTOMS TO REPORT IMMEDIATELY:  Following lower endoscopy (colonoscopy or flexible sigmoidoscopy):  Excessive amounts of blood in the stool  Significant tenderness or worsening of abdominal pains  Swelling of the abdomen that is new, acute  Fever of 100F or higher   For urgent or emergent issues, a gastroenterologist can be reached at any hour by calling [phone]. Do not use MyChart messaging for urgent concerns.    DIET:  We do recommend a small meal at first, but then you may proceed to your regular diet.  Drink plenty of fluids but you should avoid alcoholic beverages for 24 hours.  ACTIVITY:  You should plan to take it easy for the rest of today and  you should NOT DRIVE or use heavy machinery until tomorrow (because of the sedation medicines used during the test).    FOLLOW UP: Our staff will call the number listed on your records 48-72 hours following your procedure to check on you and address any questions or concerns that you may have regarding the information given to you following your procedure. If we do not reach you, we will leave a message.  We will attempt to reach you two times.  During this call, we will ask if you have developed any symptoms of COVID 19. If you develop any symptoms (ie: fever, flu-like symptoms, shortness of breath, cough etc.) before then, please call [phone].  If you test positive for Covid 19 in the 2 weeks post procedure, please call and report this information to us.    If any biopsies were taken you will be contacted by phone or by letter within the next 1-3 weeks.  Please call us at [phone] if you have not heard about the biopsies in 3 weeks.    SIGNATURES/CONFIDENTIALITY: You and/or your care partner have signed paperwork which will be entered into your electronic medical record.  These signatures attest to the fact that that the information above on your After Visit Summary has been reviewed and is understood.  Full responsibility of the confidentiality of this discharge information lies with you and/or your care-partner.  

## 2021-08-12 NOTE — Progress Notes (Signed)
Pt's states no medical or surgical changes since previsit or office visit. VS by CW. 

## 2021-08-12 NOTE — Op Note (Signed)
[facility] Patient Name: Kelli [name] Procedure Date: 08/12/2021 10:56 AM MRN: [id number] Endoscopist: Thornton Park MD, MD Age: 51 Referring MD:  Date of Birth: 15-Oct-1970 Gender: Female Account #: [id number] Procedure:                Colonoscopy Indications:              Screening for colorectal malignant neoplasm                           Mother with colon cancer at age 82                           Incomplete colon at age 76/21 due to poor prep Medicines:                Monitored Anesthesia Care Procedure:                Pre-Anesthesia Assessment:                           - Prior to the procedure, a History and Physical                            was performed, and patient medications and                            allergies were reviewed. The patient's tolerance of                            previous anesthesia was also reviewed. The risks                            and benefits of the procedure and the sedation                            options and risks were discussed with the patient.                            All questions were answered, and informed consent                            was obtained. Prior Anticoagulants: The patient has                            taken no previous anticoagulant or antiplatelet                            agents. ASA Grade Assessment: II - A patient with                            mild systemic disease. After reviewing the risks                            and benefits, the patient was deemed in  satisfactory condition to undergo the procedure.                           After obtaining informed consent, the colonoscope                            was passed under direct vision. Throughout the                            procedure, the patient's blood pressure, pulse, and                            oxygen saturations were monitored continuously. The                            CF [id number] #[id number] was  introduced through the anus                            and advanced to the 3 cm into the ileum. The                            colonoscopy was performed with moderate difficulty                            due to significant looping and a tortuous colon.                            Successful completion of the procedure was aided by                            changing the patient's position, withdrawing and                            reinserting the scope and applying abdominal                            pressure. The patient tolerated the procedure well.                            The quality of the bowel preparation was good. The                            terminal ileum, ileocecal valve, appendiceal                            orifice, and rectum were photographed. Scope In: 11:02:15 AM Scope Out: 11:17:30 AM Scope Withdrawal Time: 0 hours 8 minutes 51 seconds  Total Procedure Duration: 0 hours 15 minutes 15 seconds  Findings:                 The perianal and digital rectal examinations were                            normal.  The colon (entire examined portion) was moderately                            tortuous.                           The exam was otherwise without abnormality on                            direct and retroflexion views except for small                            internal hemorrhoids. Complications:            No immediate complications. Estimated Blood Loss:     Estimated blood loss: none. Impression:               - Tortuous colon.                           - The examination was otherwise normal on direct                            and retroflexion views.                           - No specimens collected. Recommendation:           - Patient has a contact number available for                            emergencies. The signs and symptoms of potential                            delayed complications were discussed with the                             patient. Return to normal activities tomorrow.                            Written discharge instructions were provided to the                            patient.                           - Resume previous diet.                           - Continue present medications.                           - Repeat colonoscopy in 5 years for surveillance,                            earlier with new symptoms. Plan two day bowel prep  at that time.                           - Emerging evidence supports eating a diet of                            fruits, vegetables, grains, calcium, and yogurt                            while reducing red meat and alcohol may reduce the                            risk of colon cancer.                           - Thank you for allowing me to be involved in your                            colon cancer prevention. Thornton Park MD, MD 08/12/2021 11:24:59 AM This report has been signed electronically.

## 2021-08-12 NOTE — Progress Notes (Signed)
Referring Provider: Leamon Arnt, MD Primary Care Physician:  Leamon Arnt, MD  Reason for Procedure:  Colon cancer screening   IMPRESSION:  Need for colon cancer screening Appropriate candidate for monitored anesthesia care  PLAN: Colonoscopy in the [facility] today   HPI: Kelli [name] is a 51 y.o. female presents for screening colonoscopy.  Unsuccessfully colonoscopy 01/24/20 due to retained stool. Short interval procedure recommended with 2 day bowel prep.   No baseline GI symptoms.   No known family history of colon cancer or polyps. No family history of uterine/endometrial cancer, pancreatic cancer or gastric/stomach cancer.   Past Medical History:  Diagnosis Date   Family history of colon cancer    Family history of prostate cancer    Malignant neoplasm of upper-outer quadrant of right breast in female, estrogen receptor positive ([place]) 08/18/2018   right    Migraines     Past Surgical History:  Procedure Laterality Date   BREAST BIOPSY  2004   BREAST RECONSTRUCTION WITH PLACEMENT OF TISSUE EXPANDER AND ALLODERM Bilateral 01/19/2019   Procedure: BILATERAL BREAST RECONSTRUCTION WITH PLACEMENT OF TISSUE EXPANDERS AND ALLODERM;  Surgeon: Irene Limbo, MD;  Location: [facility];  Service: Plastics;  Laterality: Bilateral;   COLONOSCOPY     DENTAL SURGERY     age 48 for caps with sedation    LIPOSUCTION WITH LIPOFILLING Bilateral 04/21/2019   Procedure: LIPOFILLING FROM ABDOMEN TO BILATERAL CHEST;  Surgeon: Irene Limbo, MD;  Location: [facility];  Service: Plastics;  Laterality: Bilateral;   NIPPLE SPARING MASTECTOMY WITH SENTINEL LYMPH NODE BIOPSY Bilateral 01/19/2019   Procedure: BILATERAL NIPPLE SPARING MASTECTOMY WITH RIGHT SENTINEL LYMPH NODE MAPPING;  Surgeon: Erroll Luna, MD;  Location: [facility];  Service: General;  Laterality: Bilateral;   REMOVAL OF BILATERAL TISSUE EXPANDERS WITH PLACEMENT OF  BILATERAL BREAST IMPLANTS Bilateral 04/21/2019   Procedure: REMOVAL OF BILATERAL TISSUE EXPANDERS WITH PLACEMENT OF BILATERAL BREAST IMPLANTS;  Surgeon: Irene Limbo, MD;  Location: [facility];  Service: Plastics;  Laterality: Bilateral;   WISDOM TOOTH EXTRACTION     no sedation    Current Outpatient Medications  Medication Sig Dispense Refill   Cholecalciferol (VITAMIN D3) 125 MCG (5000 UT) CAPS Take by mouth.     Multiple Vitamins-Minerals (EYE VITAMINS PO) Take by mouth. Ocuvite     tamoxifen (NOLVADEX) 20 MG tablet Take 1 tablet (20 mg total) by mouth daily. May resume 2 weeks after surgery 90 tablet 3   cholecalciferol (VITAMIN D3) 25 MCG (1000 UNIT) tablet Take 2,000 Units by mouth daily. (Patient not taking: Reported on 05/23/2021)     ibuprofen (ADVIL) 200 MG tablet Take 200 mg by mouth every 6 (six) hours as needed.     triamcinolone cream (KENALOG) 0.1 % Apply 1 application topically 2 (two) times daily. For 1 week, then as needed 45 g 0   Current Facility-Administered Medications  Medication Dose Route Frequency Provider Last Rate Last Admin   0.9 %  sodium chloride infusion  500 mL Intravenous Once Thornton Park, MD        Allergies as of 08/12/2021 - Review Complete 08/12/2021  Allergen Reaction Noted   Ciprofloxacin Rash 05/12/2019   Doxycycline Rash 04/14/2019   Sulfa antibiotics Rash 05/17/2018    Family History  Problem Relation Age of Onset   Learning disabilities Son    Arthritis Mother    Hypertension Mother    Cancer - Colon Mother 63  Colon cancer Mother 33   Colon polyps Mother    Hearing loss Father    Arthritis Maternal Grandmother    Hypertension Maternal Grandfather    Diabetes Paternal Grandmother    Prostate cancer Maternal Uncle    Esophageal cancer Neg Hx    Rectal cancer Neg Hx    Stomach cancer Neg Hx      Physical Exam: General:   Alert,  well-nourished, pleasant and cooperative in NAD Head:  Normocephalic and  atraumatic. Eyes:  Sclera clear, no icterus.   Conjunctiva pink. Mouth:  No deformity or lesions.   Neck:  Supple; no masses or thyromegaly. Lungs:  Clear throughout to auscultation.   No wheezes. Heart:  Regular rate and rhythm; no murmurs. Abdomen:  Soft, non-tender, nondistended, normal bowel sounds, no rebound or guarding.  Msk:  Symmetrical. No boney deformities LAD: No inguinal or umbilical LAD Extremities:  No clubbing or edema. Neurologic:  Alert and  oriented x4;  grossly nonfocal Skin:  No obvious rash or bruise. Psych:  Alert and cooperative. Normal mood and affect.     Studies/Results: No results found.    Phu Record L. Tarri Glenn, MD, MPH 08/12/2021, 10:54 AM

## 2021-08-14 ENCOUNTER — Telehealth: Payer: Self-pay | Admitting: *Deleted

## 2021-08-14 NOTE — Telephone Encounter (Signed)
°  Follow up Call-  Call back number 08/12/2021 01/24/2020  Post procedure Call Back phone  # [phone]  Permission to leave phone message Yes Yes  Some recent data might be hidden     Patient questions:  Do you have a fever, pain , or abdominal swelling? No. Pain Score  0 *  Have you tolerated food without any problems? Yes.    Have you been able to return to your normal activities? Yes.    Do you have any questions about your discharge instructions: Diet   No. Medications  No. Follow up visit  No.  Do you have questions or concerns about your Care? No.  Actions: * If pain score is 4 or above: No action needed, pain <4.

## 2021-10-22 ENCOUNTER — Ambulatory Visit (INDEPENDENT_AMBULATORY_CARE_PROVIDER_SITE_OTHER): Payer: 59

## 2021-10-22 DIAGNOSIS — Z23 Encounter for immunization: Secondary | ICD-10-CM | POA: Diagnosis not present

## 2021-10-22 NOTE — Progress Notes (Signed)
Kelli [name] 51 yr old female presents for second shingrix vaccine per Billey Chang, MD. Administered IM left arm. Patient tolerated well.  ?

## 2021-10-27 LAB — RESULTS CONSOLE HPV: CHL HPV: NEGATIVE

## 2021-10-27 LAB — HM PAP SMEAR: HPV, high-risk: NEGATIVE

## 2021-12-12 ENCOUNTER — Inpatient Hospital Stay: Payer: 59 | Attending: Hematology and Oncology | Admitting: Hematology and Oncology

## 2021-12-12 DIAGNOSIS — Z170 Estrogen receptor positive status [ER+]: Secondary | ICD-10-CM | POA: Diagnosis not present

## 2021-12-12 DIAGNOSIS — C50411 Malignant neoplasm of upper-outer quadrant of right female breast: Secondary | ICD-10-CM | POA: Diagnosis present

## 2021-12-12 DIAGNOSIS — Z9013 Acquired absence of bilateral breasts and nipples: Secondary | ICD-10-CM | POA: Diagnosis not present

## 2021-12-12 DIAGNOSIS — Z79810 Long term (current) use of selective estrogen receptor modulators (SERMs): Secondary | ICD-10-CM | POA: Diagnosis not present

## 2021-12-12 MED ORDER — TAMOXIFEN CITRATE 20 MG PO TABS: 20.0000 mg | ORAL_TABLET | Freq: Every day | ORAL | 3 refills | Status: DC

## 2021-12-12 NOTE — Assessment & Plan Note (Signed)
Screening detected right breast mass right breast biopsy 11 o'clock position: Invasive mammary cancer mixed histology, ER 95%, PR 95%, Ki-67 10%, HER-2 -1+ by IHC, ?Stage Ia ?Oncotype DX recurrence score: 10, risk of distant recurrence 3%, low risk ?Breast MRI 08/15/2018: Large area of non-mass enhancement 5.3 x 4.3 x 3.6 cm, medial aspect and another mass 1.8 cm and 2 adjacent satellite lesions total measuring 4 x 3 cm. ?Scattered 6 to 7 mm nodules lower portion right breast. ?Left breast 0.6 cm enhancing mass (biopsy intermediate grade LCIS) ?? ?Treatment plan: ?1.??Neoadjuvant antiestrogen therapy with tamoxifen started 08/19/2018 x 6 months ?2.??01/19/2019:Bilateral mastectomies with reconstruction: Right mastectomy: Residual invasive carcinoma, multifocal, 1.6 cm, 0/3 lymph nodes, margins negative, ER 95%, PR 95%, HER-2 -1+, Ki-67 10%, residual cancer burden 1.63, left mastectomy: LCIS ?3.??Followed by adjuvant antiestrogen therapy?with tamoxifen x10 years started 02/01/2020/switched to anastrozole once she becomes menopausal. ?------------------------------------------------------------------------------------------------------------------------------------------------------------------ ?Tamoxifen toxicities: ?1.??Mild hot flashes: Seem to wake her once or twice at night. ?They are not severe enough to warrant any additional treatments. ?She will try to switch the time of the day she takes tamoxifen and see if it makes a difference. ?Denies any mood swings or muscle cramps or any other side effects. ?? ?May 2022: FSH 20.5, estradiol 359  ?? ?Breast cancer surveillance: ?1.??Breast exam 12/13/21: Benign ?2.??No role of imaging studies since she had bilateral mastectomies. ?We checked estradiol levels to see if she is in menopause.  The levels were extremely high and do not indicate menopause. ?We decided not to do any blood work next year. ?? ?Return to clinic in 1 year for follow-up ?

## 2021-12-12 NOTE — Progress Notes (Signed)
? ?Patient Care Team: ?Leamon Arnt, MD as PCP - General (Family Medicine) ?Eppie Gibson, MD as Attending Physician (Radiation Oncology) ?Irene Limbo, MD as Consulting Physician (Plastic Surgery) ?Nicholas Lose, MD as Consulting Physician (Hematology and Oncology) ?Erroll Luna, MD as Consulting Physician (General Surgery) ?Marylynn Pearson, MD as Consulting Physician (Obstetrics and Gynecology) ? ?DIAGNOSIS:  ?Encounter Diagnosis  ?Name Primary?  ? Malignant neoplasm of upper-outer quadrant of right breast in female, estrogen receptor positive ([place])   ? ? ?SUMMARY OF ONCOLOGIC HISTORY: ?Oncology History  ?Malignant neoplasm of upper-outer quadrant of right breast in female, estrogen receptor positive ([place])  ?07/22/2018 Initial Diagnosis  ? Screening detected right breast mass right breast biopsy 11 o'clock position: Invasive mammary cancer mixed histology, ER 95%, PR 95%, Ki-67 10%, HER-2 -1+ by IHC, ?  ?08/14/2018 Breast MRI  ? Right breast upper central portion non-mass enhancement 5.3 cm, along the medial aspect discrete mass 1.8 cm, 2 adjacent satellite nodules together make 4 cm.  Scattered 6 to 7 mm enhancing nodules lower portion right breast, left breast 0.6 cm enhancing mass ? ? ?  ?08/19/2018 Cancer Staging  ? Staging form: Breast, AJCC 8th Edition ?- Clinical: Stage IA (cT1c, cN0, cM0, G2, ER+, PR+, HER2-) - Signed by Eppie Gibson, MD on 08/19/2018 ? ?  ?08/2018 -  Anti-estrogen oral therapy  ? Tamoxifen daily; plan for 10 years of therapy ?  ?01/19/2019 Surgery  ? Bilateral mastectomies with reconstruction ([facility]): Right mastectomy: Residual invasive carcinoma, multifocal, 1.6 cm, 0/3 lymph nodes, margins negative, ER 95%, PR 95%, HER-2 -1+, Ki-67 10%, residual cancer burden 1.63, left mastectomy: LCIS ?  ?01/19/2019 Cancer Staging  ? Staging form: Breast, AJCC 8th Edition ?- Pathologic stage from 01/19/2019: No Stage Recommended (ypT1c, pN0, cM0, G2, ER+, PR+, HER2-) - Signed by  Gardenia Phlegm, NP on 02/08/2019 ? ?  ? ? ?CHIEF COMPLIANT: Follow-up of right breast cancer on tamoxifen ? ?INTERVAL HISTORY: Kelli [name] is a 51 y.o. with above-mentioned history of right breast cancer treated with neoadjuvant tamoxifen therapy, bilateral mastectomies with reconstruction, and is currently on antiestrogen therapy with tamoxifen. She presents to the clinic today for follow-up. She states she is tolerating Tamoxifen. Complains of bearable hot flashes. Denies pain and discomfort. ? ? ?ALLERGIES:  is allergic to ciprofloxacin, doxycycline, and sulfa antibiotics. ? ?MEDICATIONS:  ?Current Outpatient Medications  ?Medication Sig Dispense Refill  ? Cholecalciferol (VITAMIN D3) 125 MCG (5000 UT) CAPS Take by mouth.    ? ibuprofen (ADVIL) 200 MG tablet Take 200 mg by mouth every 6 (six) hours as needed.    ? Multiple Vitamins-Minerals (EYE VITAMINS PO) Take by mouth. Ocuvite    ? triamcinolone cream (KENALOG) 0.1 % Apply 1 application topically 2 (two) times daily. For 1 week, then as needed 45 g 0  ? tamoxifen (NOLVADEX) 20 MG tablet Take 1 tablet (20 mg total) by mouth daily. May resume 2 weeks after surgery 90 tablet 3  ? ?No current facility-administered medications for this visit.  ? ? ?PHYSICAL EXAMINATION: ?ECOG PERFORMANCE STATUS: 1 - Symptomatic but completely ambulatory ? ?Vitals:  ? 12/12/21 1045  ?BP: (!) 110/56  ?Pulse: 88  ?Resp: 16  ?Temp: 97.9 ?F (36.6 ?C)  ?SpO2: 99%  ? ?Filed Weights  ? 12/12/21 1045  ?Weight: 149 lb 6.4 oz (67.8 kg)  ? ? ?BREAST: Bilateral mastectomies with reconstruction without any palpable lumps or nodules in the axilla. (exam performed in the presence of a chaperone) ? ?  LABORATORY DATA:  ?I have reviewed the data as listed ? ?  Latest Ref Rng & Units 05/23/2021  ? 10:25 AM 11/26/2020  ? 10:52 AM 11/28/2019  ?  8:13 AM  ?CMP  ?Glucose 70 - 99 mg/dL 84   94   92    ?BUN 6 - 23 mg/dL '14   14   12    ' ?Creatinine 0.40 - 1.20 mg/dL 0.67   0.66   0.70    ?Sodium  135 - 145 mEq/L 140   141   142    ?Potassium 3.5 - 5.1 mEq/L 4.2   3.9   3.6    ?Chloride 96 - 112 mEq/L 106   108   107    ?CO2 19 - 32 mEq/L '28   26   26    ' ?Calcium 8.4 - 10.5 mg/dL 9.1   8.6   8.9    ?Total Protein 6.0 - 8.3 g/dL 6.9   6.7   6.8    ?Total Bilirubin 0.2 - 1.2 mg/dL 0.8   0.9   0.7    ?Alkaline Phos 39 - 117 U/L 43   56   55    ?AST 0 - 37 U/L '16   16   15    ' ?ALT 0 - 35 U/L '11   9   14    ' ? ? ?Lab Results  ?Component Value Date  ? WBC 6.6 05/23/2021  ? HGB 12.6 05/23/2021  ? HCT 37.5 05/23/2021  ? MCV 94.2 05/23/2021  ? PLT 234.0 05/23/2021  ? NEUTROABS 4.7 05/23/2021  ? ? ?ASSESSMENT & PLAN:  ?Malignant neoplasm of upper-outer quadrant of right breast in female, estrogen receptor positive ([facility]) ?Screening detected right breast mass right breast biopsy 11 o'clock position: Invasive mammary cancer mixed histology, ER 95%, PR 95%, Ki-67 10%, HER-2 -1+ by IHC, ?Stage Ia ?Oncotype DX recurrence score: 10, risk of distant recurrence 3%, low risk ?Breast MRI 08/15/2018: Large area of non-mass enhancement 5.3 x 4.3 x 3.6 cm, medial aspect and another mass 1.8 cm and 2 adjacent satellite lesions total measuring 4 x 3 cm.  Scattered 6 to 7 mm nodules lower portion right breast.  Left breast 0.6 cm enhancing mass (biopsy intermediate grade LCIS) ?  ?Treatment plan: ?1.  Neoadjuvant antiestrogen therapy with tamoxifen started 08/19/2018 x 6 months ?2.  01/19/2019:Bilateral mastectomies with reconstruction: Right mastectomy: Residual invasive carcinoma, multifocal, 1.6 cm, 0/3 lymph nodes, margins negative, ER 95%, PR 95%, HER-2 -1+, Ki-67 10%, residual cancer burden 1.63, left mastectomy: LCIS ?3.  Followed by adjuvant antiestrogen therapy with tamoxifen x10 years started 02/01/2020/switched to anastrozole once she becomes menopausal. ?------------------------------------------------------------------------------------------------------------------------------------------------------------------ ?Tamoxifen  toxicities: ?1.  Mild hot flashes: Patient is tolerating them fairly well.. ?  ?May 2022: FSH 20.5, estradiol 359  ?  ?Breast cancer surveillance: ?1.  Breast exam 12/13/21: Benign ?2.  No role of imaging studies since she had bilateral mastectomies. ?  ?She is originally from [place] and plans to go to [place] for a month in the summer closer to [place]. ?Return to clinic in 1 year for follow-up ? ? ?No orders of the defined types were placed in this encounter. ? ?The patient has a good understanding of the overall plan. she agrees with it. she will call with any problems that may develop before the next visit here. ?Total time spent: 30 mins including face to face time and time spent for planning, charting and co-ordination of care ? ? [facility]  Loyal Gambler, MD ?12/12/21 ? ? ? I Gardiner Coins am scribing for Dr. Lindi Adie ? ?I have reviewed the above documentation for accuracy and completeness, and I agree with the above. ?  ?

## 2022-04-27 ENCOUNTER — Encounter: Payer: Self-pay | Admitting: *Deleted

## 2022-07-16 ENCOUNTER — Encounter: Payer: Self-pay | Admitting: *Deleted

## 2022-12-10 NOTE — Progress Notes (Signed)
Patient Care Team: Serena Croissant, MD as PCP - General (Hematology and Oncology) Lonie Peak, MD as Attending Physician (Radiation Oncology) Glenna Fellows, MD as Consulting Physician (Plastic Surgery) Serena Croissant, MD as Consulting Physician (Hematology and Oncology) Harriette Bouillon, MD as Consulting Physician (General Surgery) Zelphia Cairo, MD as Consulting Physician (Obstetrics and Gynecology)  DIAGNOSIS:  Encounter Diagnosis  Name Primary?   Malignant neoplasm of upper-outer quadrant of right breast in female, estrogen receptor positive ([facility]) Yes    SUMMARY OF ONCOLOGIC HISTORY: Oncology History  Malignant neoplasm of upper-outer quadrant of right breast in female, estrogen receptor positive ([facility])  07/22/2018 Initial Diagnosis   Screening detected right breast mass right breast biopsy 11 o'clock position: Invasive mammary cancer mixed histology, ER 95%, PR 95%, Ki-67 10%, HER-2 -1+ by IHC,   08/14/2018 Breast MRI   Right breast upper central portion non-mass enhancement 5.3 cm, along the medial aspect discrete mass 1.8 cm, 2 adjacent satellite nodules together make 4 cm.  Scattered 6 to 7 mm enhancing nodules lower portion right breast, left breast 0.6 cm enhancing mass    08/19/2018 Cancer Staging   Staging form: Breast, AJCC 8th Edition - Clinical: Stage IA (cT1c, cN0, cM0, G2, ER+, PR+, HER2-) - Signed by Lonie Peak, MD on 08/19/2018   08/2018 -  Anti-estrogen oral therapy   Tamoxifen daily; plan for 10 years of therapy   01/19/2019 Surgery   Bilateral mastectomies with reconstruction ([facility]): Right mastectomy: Residual invasive carcinoma, multifocal, 1.6 cm, 0/3 lymph nodes, margins negative, ER 95%, PR 95%, HER-2 -1+, Ki-67 10%, residual cancer burden 1.63, left mastectomy: LCIS   01/19/2019 Cancer Staging   Staging form: Breast, AJCC 8th Edition - Pathologic stage from 01/19/2019: No Stage Recommended (ypT1c, pN0, cM0, G2, ER+, PR+, HER2-) -  Signed by Loa Socks, NP on 02/08/2019     CHIEF COMPLIANT: Follow-up of right breast cancer on tamoxifen   INTERVAL HISTORY: Laterika Boesch is a 52 y.o. with above-mentioned history of right breast cancer treated with neoadjuvant tamoxifen therapy. Currently on anastrozole therapy. She presents to the clinic for a follow-up. She reports that she is tolerating the tamoxifen. She does have some mild hot flashes normally at night. She denies any pain or discomfort in breast.    ALLERGIES:  is allergic to ciprofloxacin, doxycycline, and sulfa antibiotics.  MEDICATIONS:  Current Outpatient Medications  Medication Sig Dispense Refill   Cholecalciferol (VITAMIN D3) 125 MCG (5000 UT) CAPS Take by mouth.     Ferrous Sulfate (IRON PO) Take by mouth.     ibuprofen (ADVIL) 200 MG tablet Take 200 mg by mouth every 6 (six) hours as needed.     Multiple Vitamins-Minerals (EYE VITAMINS PO) Take by mouth. Ocuvite     tamoxifen (NOLVADEX) 20 MG tablet Take 1 tablet (20 mg total) by mouth daily. May resume 2 weeks after surgery 90 tablet 3   triamcinolone cream (KENALOG) 0.1 % Apply 1 application topically 2 (two) times daily. For 1 week, then as needed 45 g 0   No current facility-administered medications for this visit.    PHYSICAL EXAMINATION: ECOG PERFORMANCE STATUS: 1 - Symptomatic but completely ambulatory  Vitals:   12/14/22 0936  BP: (!) 115/57  Pulse: 69  Resp: 18  Temp: 97.8 F (36.6 C)  SpO2: 100%   Filed Weights   12/14/22 0936  Weight: 152 lb 12.8 oz (69.3 kg)    BREAST: Bilateral breast reconstructions are without any lumps or nodules  of concern (exam performed in the presence of a chaperone)  LABORATORY DATA:  I have reviewed the data as listed    Latest Ref Rng & Units 05/23/2021   10:25 AM 11/26/2020   10:52 AM 11/28/2019    8:13 AM  CMP  Glucose 70 - 99 mg/dL 84  94  92   BUN 6 - 23 mg/dL 14  14  12    Creatinine 0.40 - 1.20 mg/dL 3.24  4.01  0.27    Sodium 135 - 145 mEq/L 140  141  142   Potassium 3.5 - 5.1 mEq/L 4.2  3.9  3.6   Chloride 96 - 112 mEq/L 106  108  107   CO2 19 - 32 mEq/L 28  26  26    Calcium 8.4 - 10.5 mg/dL 9.1  8.6  8.9   Total Protein 6.0 - 8.3 g/dL 6.9  6.7  6.8   Total Bilirubin 0.2 - 1.2 mg/dL 0.8  0.9  0.7   Alkaline Phos 39 - 117 U/L 43  56  55   AST 0 - 37 U/L 16  16  15    ALT 0 - 35 U/L 11  9  14      Lab Results  Component Value Date   WBC 6.6 05/23/2021   HGB 12.6 05/23/2021   HCT 37.5 05/23/2021   MCV 94.2 05/23/2021   PLT 234.0 05/23/2021   NEUTROABS 4.7 05/23/2021    ASSESSMENT & PLAN:  Malignant neoplasm of upper-outer quadrant of right breast in female, estrogen receptor positive ([facility]) Screening detected right breast mass right breast biopsy 11 o'clock position: Invasive mammary cancer mixed histology, ER 95%, PR 95%, Ki-67 10%, HER-2 -1+ by IHC, Stage Ia Oncotype DX recurrence score: 10, risk of distant recurrence 3%, low risk Breast MRI 08/15/2018: Large area of non-mass enhancement 5.3 x 4.3 x 3.6 cm, medial aspect and another mass 1.8 cm and 2 adjacent satellite lesions total measuring 4 x 3 cm.  Scattered 6 to 7 mm nodules lower portion right breast.  Left breast 0.6 cm enhancing mass (biopsy intermediate grade LCIS)   Treatment plan: 1.  Neoadjuvant antiestrogen therapy with tamoxifen started 08/19/2018 x 6 months 2.  01/19/2019:Bilateral mastectomies with reconstruction: Right mastectomy: Residual invasive carcinoma, multifocal, 1.6 cm, 0/3 lymph nodes, margins negative, ER 95%, PR 95%, HER-2 -1+, Ki-67 10%, residual cancer burden 1.63, left mastectomy: LCIS 3.  Followed by adjuvant antiestrogen therapy with tamoxifen x10 years started 02/01/2020/switched to anastrozole once she becomes menopausal. ------------------------------------------------------------------------------------------------------------------------------------------------------------------ Tamoxifen toxicities: 1.  Mild hot  flashes: Patient is tolerating them fairly well.. May 2022: FSH 20.5, estradiol 359  We will redo her FSH and estradiol labs today. I will call her with the result of this labs and then determine if you want to switch her from tamoxifen to anastrozole.  Breast cancer surveillance: 1.  Breast exam 12/14/2022: Benign 2.  No role of imaging studies since she had bilateral mastectomies.   She is originally from [place] and plans to go to [place] for a month in the summer closer to [place]. Return to clinic in 1 year for follow-up.  Telephone visit in 2 weeks to discuss [facility] and estradiol    Orders Placed This Encounter  Procedures   FSH-Follicle stimulating hormone    Standing Status:   Future    Standing Expiration Date:   12/14/2023   Estradiol, Sensitive    Standing Status:   Future    Standing Expiration Date:   12/14/2023  The patient has a good understanding of the overall plan. she agrees with it. she will call with any problems that may develop before the next visit here. Total time spent: 30 mins including face to face time and time spent for planning, charting and co-ordination of care   Tamsen Meek, MD 12/14/22    I Janan Ridge am acting as a Neurosurgeon for [employer]  I have reviewed the above documentation for accuracy and completeness, and I agree with the above.

## 2022-12-14 ENCOUNTER — Inpatient Hospital Stay: Payer: 59 | Attending: Hematology and Oncology | Admitting: Hematology and Oncology

## 2022-12-14 ENCOUNTER — Inpatient Hospital Stay: Payer: 59

## 2022-12-14 ENCOUNTER — Other Ambulatory Visit: Payer: Self-pay

## 2022-12-14 VITALS — BP 115/57 | HR 69 | Temp 97.8°F | Resp 18 | Ht 66.0 in | Wt 152.8 lb

## 2022-12-14 DIAGNOSIS — C50411 Malignant neoplasm of upper-outer quadrant of right female breast: Secondary | ICD-10-CM | POA: Diagnosis not present

## 2022-12-14 DIAGNOSIS — Z170 Estrogen receptor positive status [ER+]: Secondary | ICD-10-CM

## 2022-12-14 DIAGNOSIS — Z79899 Other long term (current) drug therapy: Secondary | ICD-10-CM | POA: Diagnosis not present

## 2022-12-14 DIAGNOSIS — Z79810 Long term (current) use of selective estrogen receptor modulators (SERMs): Secondary | ICD-10-CM | POA: Diagnosis not present

## 2022-12-14 DIAGNOSIS — Z9013 Acquired absence of bilateral breasts and nipples: Secondary | ICD-10-CM | POA: Insufficient documentation

## 2022-12-14 NOTE — Assessment & Plan Note (Signed)
Screening detected right breast mass right breast biopsy 11 o'clock position: Invasive mammary cancer mixed histology, ER 95%, PR 95%, Ki-67 10%, HER-2 -1+ by IHC, Stage Ia Oncotype DX recurrence score: 10, risk of distant recurrence 3%, low risk Breast MRI 08/15/2018: Large area of non-mass enhancement 5.3 x 4.3 x 3.6 cm, medial aspect and another mass 1.8 cm and 2 adjacent satellite lesions total measuring 4 x 3 cm.  Scattered 6 to 7 mm nodules lower portion right breast.  Left breast 0.6 cm enhancing mass (biopsy intermediate grade LCIS)   Treatment plan: 1.  Neoadjuvant antiestrogen therapy with tamoxifen started 08/19/2018 x 6 months 2.  01/19/2019:Bilateral mastectomies with reconstruction: Right mastectomy: Residual invasive carcinoma, multifocal, 1.6 cm, 0/3 lymph nodes, margins negative, ER 95%, PR 95%, HER-2 -1+, Ki-67 10%, residual cancer burden 1.63, left mastectomy: LCIS 3.  Followed by adjuvant antiestrogen therapy with tamoxifen x10 years started 02/01/2020/switched to anastrozole once she becomes menopausal. ------------------------------------------------------------------------------------------------------------------------------------------------------------------ Tamoxifen toxicities: 1.  Mild hot flashes: Patient is tolerating them fairly well..   May 2022: FSH 20.5, estradiol 359    Breast cancer surveillance: 1.  Breast exam 12/14/2022: Benign 2.  No role of imaging studies since she had bilateral mastectomies.   She is originally from [place] and plans to go to [place] for a month in the summer closer to [place]. Return to clinic in 1 year for follow-up

## 2022-12-15 LAB — FOLLICLE STIMULATING HORMONE: FSH: 25.9 m[IU]/mL

## 2022-12-19 LAB — ESTRADIOL, ULTRA SENS: Estradiol, Sensitive: 100.4 pg/mL

## 2022-12-28 NOTE — Progress Notes (Signed)
HEMATOLOGY-ONCOLOGY TELEPHONE VISIT PROGRESS NOTE  I connected with our patient on 01/07/23 at 11:00 AM EDT by telephone and verified that I am speaking with the correct person using two identifiers.  I discussed the limitations, risks, security and privacy concerns of performing an evaluation and management service by telephone and the availability of in person appointments.  I also discussed with the patient that there may be a patient responsible charge related to this service. The patient expressed understanding and agreed to proceed.   History of Present Illness: Kelli [name] is a 52 y.o. with a history of right breast cancer currently on tamoxifen therapy.  We had performed blood work to see if she was in menopause so that we can consider switching to aromatase inhibitor therapy but she is not yet in menopause.  She does have mild hot flashes but otherwise tolerating tamoxifen fairly well.  Oncology History  Malignant neoplasm of upper-outer quadrant of right breast in female, estrogen receptor positive (HCC)  07/22/2018 Initial Diagnosis   Screening detected right breast mass right breast biopsy 11 o'clock position: Invasive mammary cancer mixed histology, ER 95%, PR 95%, Ki-67 10%, HER-2 -1+ by IHC,   08/14/2018 Breast MRI   Right breast upper central portion non-mass enhancement 5.3 cm, along the medial aspect discrete mass 1.8 cm, 2 adjacent satellite nodules together make 4 cm.  Scattered 6 to 7 mm enhancing nodules lower portion right breast, left breast 0.6 cm enhancing mass    08/19/2018 Cancer Staging   Staging form: Breast, AJCC 8th Edition - Clinical: Stage IA (cT1c, cN0, cM0, G2, ER+, PR+, HER2-) - Signed by Lonie Peak, MD on 08/19/2018   08/2018 -  Anti-estrogen oral therapy   Tamoxifen daily; plan for 10 years of therapy   01/19/2019 Surgery   Bilateral mastectomies with reconstruction ([facility]): Right mastectomy: Residual invasive carcinoma, multifocal,  1.6 cm, 0/3 lymph nodes, margins negative, ER 95%, PR 95%, HER-2 -1+, Ki-67 10%, residual cancer burden 1.63, left mastectomy: LCIS   01/19/2019 Cancer Staging   Staging form: Breast, AJCC 8th Edition - Pathologic stage from 01/19/2019: No Stage Recommended (ypT1c, pN0, cM0, G2, ER+, PR+, HER2-) - Signed by Loa Socks, NP on 02/08/2019     REVIEW OF SYSTEMS:   Constitutional: Denies fevers, chills or abnormal weight loss All other systems were reviewed with the patient and are negative. Observations/Objective:     Assessment Plan:  Malignant neoplasm of upper-outer quadrant of right breast in female, estrogen receptor positive (HCC) Screening detected right breast mass right breast biopsy 11 o'clock position: Invasive mammary cancer mixed histology, ER 95%, PR 95%, Ki-67 10%, HER-2 -1+ by IHC, Stage Ia Oncotype DX recurrence score: 10, risk of distant recurrence 3%, low risk Breast MRI 08/15/2018: Large area of non-mass enhancement 5.3 x 4.3 x 3.6 cm, medial aspect and another mass 1.8 cm and 2 adjacent satellite lesions total measuring 4 x 3 cm.  Scattered 6 to 7 mm nodules lower portion right breast.  Left breast 0.6 cm enhancing mass (biopsy intermediate grade LCIS)   Treatment plan: 1.  Neoadjuvant antiestrogen therapy with tamoxifen started 08/19/2018 x 6 months 2.  01/19/2019:Bilateral mastectomies with reconstruction: Right mastectomy: Residual invasive carcinoma, multifocal, 1.6 cm, 0/3 lymph nodes, margins negative, ER 95%, PR 95%, HER-2 -1+, Ki-67 10%, residual cancer burden 1.63, left mastectomy: LCIS 3.  Followed by adjuvant antiestrogen therapy with tamoxifen x10 years started 02/01/2020/switched to anastrozole once she becomes menopausal. ------------------------------------------------------------------------------------------------------------------------------------------------------------------ Tamoxifen toxicities: 1.  Mild hot flashes: Patient is tolerating them  fairly well.. May 2022: FSH 20.5, estradiol 359  12/14/2022: FSH 25.9, estradiol 100.4 Patient is not in menopause and therefore we will need to continue with the tamoxifen.   Breast cancer surveillance: 1.  Breast exam 12/14/2022: Benign 2.  No role of imaging studies since she had bilateral mastectomies.   She is originally from [place] and plans to go to [place] for a month in the summer closer to [place]. Return to clinic in 1 year for follow-up and we will repeat lab testing at that time.    I discussed the assessment and treatment plan with the patient. The patient was provided an opportunity to ask questions and all were answered. The patient agreed with the plan and demonstrated an understanding of the instructions. The patient was advised to call back or seek an in-person evaluation if the symptoms worsen or if the condition fails to improve as anticipated.   I provided 12 minutes of non-face-to-face time during this encounter.  This includes time for charting and coordination of care   Tamsen Meek, MD  I Janan Ridge am acting as a scribe for Dr.Alyna Stensland  I have reviewed the above documentation for accuracy and completeness, and I agree with the above.

## 2022-12-31 ENCOUNTER — Other Ambulatory Visit: Payer: Self-pay | Admitting: Hematology and Oncology

## 2023-01-07 ENCOUNTER — Inpatient Hospital Stay: Payer: 59 | Attending: Hematology and Oncology | Admitting: Hematology and Oncology

## 2023-01-07 DIAGNOSIS — Z9013 Acquired absence of bilateral breasts and nipples: Secondary | ICD-10-CM | POA: Insufficient documentation

## 2023-01-07 DIAGNOSIS — Z79810 Long term (current) use of selective estrogen receptor modulators (SERMs): Secondary | ICD-10-CM | POA: Diagnosis not present

## 2023-01-07 DIAGNOSIS — C50411 Malignant neoplasm of upper-outer quadrant of right female breast: Secondary | ICD-10-CM | POA: Diagnosis present

## 2023-01-07 DIAGNOSIS — Z170 Estrogen receptor positive status [ER+]: Secondary | ICD-10-CM | POA: Diagnosis not present

## 2023-01-07 NOTE — Assessment & Plan Note (Signed)
Screening detected right breast mass right breast biopsy 11 o'clock position: Invasive mammary cancer mixed histology, ER 95%, PR 95%, Ki-67 10%, HER-2 -1+ by IHC, Stage Ia Oncotype DX recurrence score: 10, risk of distant recurrence 3%, low risk Breast MRI 08/15/2018: Large area of non-mass enhancement 5.3 x 4.3 x 3.6 cm, medial aspect and another mass 1.8 cm and 2 adjacent satellite lesions total measuring 4 x 3 cm.  Scattered 6 to 7 mm nodules lower portion right breast.  Left breast 0.6 cm enhancing mass (biopsy intermediate grade LCIS)   Treatment plan: 1.  Neoadjuvant antiestrogen therapy with tamoxifen started 08/19/2018 x 6 months 2.  01/19/2019:Bilateral mastectomies with reconstruction: Right mastectomy: Residual invasive carcinoma, multifocal, 1.6 cm, 0/3 lymph nodes, margins negative, ER 95%, PR 95%, HER-2 -1+, Ki-67 10%, residual cancer burden 1.63, left mastectomy: LCIS 3.  Followed by adjuvant antiestrogen therapy with tamoxifen x10 years started 02/01/2020/switched to anastrozole once she becomes menopausal. ------------------------------------------------------------------------------------------------------------------------------------------------------------------ Tamoxifen toxicities: 1.  Mild hot flashes: Patient is tolerating them fairly well.. May 2022: FSH 20.5, estradiol 359  12/14/2022: FSH 25.9, estradiol 100.4 Patient is not in menopause and therefore we will need to continue with the tamoxifen.   Breast cancer surveillance: 1.  Breast exam 12/14/2022: Benign 2.  No role of imaging studies since she had bilateral mastectomies.   She is originally from [place] and plans to go to [place] for a month in the summer closer to [place]. Return to clinic in 1 year for follow-up and we will repeat lab testing at that time.

## 2023-12-14 ENCOUNTER — Inpatient Hospital Stay

## 2023-12-14 ENCOUNTER — Inpatient Hospital Stay: Attending: Hematology and Oncology | Admitting: Hematology and Oncology

## 2023-12-14 ENCOUNTER — Encounter: Payer: Self-pay | Admitting: Hematology and Oncology

## 2023-12-14 VITALS — BP 124/78 | HR 65 | Temp 98.3°F | Resp 17 | Ht 66.0 in | Wt 157.0 lb

## 2023-12-14 DIAGNOSIS — Z9013 Acquired absence of bilateral breasts and nipples: Secondary | ICD-10-CM | POA: Insufficient documentation

## 2023-12-14 DIAGNOSIS — C50411 Malignant neoplasm of upper-outer quadrant of right female breast: Secondary | ICD-10-CM

## 2023-12-14 DIAGNOSIS — Z1721 Progesterone receptor positive status: Secondary | ICD-10-CM | POA: Insufficient documentation

## 2023-12-14 DIAGNOSIS — Z1732 Human epidermal growth factor receptor 2 negative status: Secondary | ICD-10-CM | POA: Insufficient documentation

## 2023-12-14 DIAGNOSIS — Z170 Estrogen receptor positive status [ER+]: Secondary | ICD-10-CM | POA: Diagnosis not present

## 2023-12-14 DIAGNOSIS — Z79810 Long term (current) use of selective estrogen receptor modulators (SERMs): Secondary | ICD-10-CM | POA: Diagnosis not present

## 2023-12-14 MED ORDER — TAMOXIFEN CITRATE 20 MG PO TABS: 20.0000 mg | ORAL_TABLET | Freq: Every day | ORAL | 3 refills | Status: DC

## 2023-12-14 NOTE — Assessment & Plan Note (Signed)
 Screening detected right breast mass right breast biopsy 11 o'clock position: Invasive mammary cancer mixed histology, ER 95%, PR 95%, Ki-67 10%, HER-2 -1+ by IHC, Stage Ia Oncotype DX recurrence score: 10, risk of distant recurrence 3%, low risk Breast MRI 08/15/2018: Large area of non-mass enhancement 5.3 x 4.3 x 3.6 cm, medial aspect and another mass 1.8 cm and 2 adjacent satellite lesions total measuring 4 x 3 cm.  Scattered 6 to 7 mm nodules lower portion right breast.  Left breast 0.6 cm enhancing mass (biopsy intermediate grade LCIS)   Treatment plan: 1.  Neoadjuvant antiestrogen therapy with tamoxifen  started 08/19/2018 x 6 months 2.  01/19/2019:Bilateral mastectomies with reconstruction: Right mastectomy: Residual invasive carcinoma, multifocal, 1.6 cm, 0/3 lymph nodes, margins negative, ER 95%, PR 95%, HER-2 -1+, Ki-67 10%, residual cancer burden 1.63, left mastectomy: LCIS 3.  Followed by adjuvant antiestrogen therapy with tamoxifen  x10 years started 02/01/2020/switched to anastrozole once she becomes menopausal. ------------------------------------------------------------------------------------------------------------------------------------------------------------------ Tamoxifen  toxicities: 1.  Mild hot flashes: Patient is tolerating them fairly well.. May 2022: FSH 20.5, estradiol  359  12/14/2022: FSH 25.9, estradiol  100.4 Patient is not in menopause and therefore we will need to continue with the tamoxifen .   Breast cancer surveillance: 1.  Breast exam 12/14/2023: Benign 2.  No role of imaging studies since she had bilateral mastectomies.   She is originally from [place] and plans to go to [place] for a month in the summer closer to [place]. Return to clinic in 1 year for follow-up and we will repeat lab testing at that time.

## 2023-12-14 NOTE — Progress Notes (Signed)
 Patient Care Team: Cameron Cea, MD as PCP - General (Hematology and Oncology) Colie Dawes, MD as Attending Physician (Radiation Oncology) Alger Infield, MD as Consulting Physician (Plastic Surgery) Cameron Cea, MD as Consulting Physician (Hematology and Oncology) Sim Dryer, MD as Consulting Physician (General Surgery) Ashby Lawman, MD as Consulting Physician (Obstetrics and Gynecology)  DIAGNOSIS:  Encounter Diagnosis  Name Primary?   Malignant neoplasm of upper-outer quadrant of right breast in female, estrogen receptor positive (HCC) Yes    SUMMARY OF ONCOLOGIC HISTORY: Oncology History  Malignant neoplasm of upper-outer quadrant of right breast in female, estrogen receptor positive (HCC)  07/22/2018 Initial Diagnosis   Screening detected right breast mass right breast biopsy 11 o'clock position: Invasive mammary cancer mixed histology, ER 95%, PR 95%, Ki-67 10%, HER-2 -1+ by IHC,   08/14/2018 Breast MRI   Right breast upper central portion non-mass enhancement 5.3 cm, along the medial aspect discrete mass 1.8 cm, 2 adjacent satellite nodules together make 4 cm.  Scattered 6 to 7 mm enhancing nodules lower portion right breast, left breast 0.6 cm enhancing mass    08/19/2018 Cancer Staging   Staging form: Breast, AJCC 8th Edition - Clinical: Stage IA (cT1c, cN0, cM0, G2, ER+, PR+, HER2-) - Signed by Colie Dawes, MD on 08/19/2018   08/2018 -  Anti-estrogen oral therapy   Tamoxifen  daily; plan for 10 years of therapy   01/19/2019 Surgery   Bilateral mastectomies with reconstruction ([facility]): Right mastectomy: Residual invasive carcinoma, multifocal, 1.6 cm, 0/3 lymph nodes, margins negative, ER 95%, PR 95%, HER-2 -1+, Ki-67 10%, residual cancer burden 1.63, left mastectomy: LCIS   01/19/2019 Cancer Staging   Staging form: Breast, AJCC 8th Edition - Pathologic stage from 01/19/2019: No Stage Recommended (ypT1c, pN0, cM0, G2, ER+, PR+, HER2-) -  Signed by Percival Brace, NP on 02/08/2019     CHIEF COMPLIANT:   HISTORY OF PRESENT ILLNESS: Follow-up on tamoxifen  therapy  History of Present Illness Kelli [name] "Kelli [name]" is a 53 year old female who presents for routine follow-up.  She denies any lumps or nodules in the breasts  She continues to take tamoxifen  without significant issues but experiences an increase in hot flashes over the past year. These hot flashes are somewhat uncomfortable but manageable.     ALLERGIES:  is allergic to ciprofloxacin , doxycycline , and sulfa antibiotics.  MEDICATIONS:  Current Outpatient Medications  Medication Sig Dispense Refill   Cholecalciferol (VITAMIN D3) 125 MCG (5000 UT) CAPS Take by mouth.     Multiple Vitamins-Minerals (EYE VITAMINS PO) Take by mouth. Ocuvite     triamcinolone  cream (KENALOG ) 0.1 % Apply 1 application topically 2 (two) times daily. For 1 week, then as needed 45 g 0   tamoxifen  (NOLVADEX ) 20 MG tablet Take 1 tablet (20 mg total) by mouth daily. May resume 2 weeks after surgery 90 tablet 3   No current facility-administered medications for this visit.    PHYSICAL EXAMINATION: ECOG PERFORMANCE STATUS: 1 - Symptomatic but completely ambulatory  Vitals:   12/14/23 1432 12/14/23 1438  BP: (!) 111/45 124/78  Pulse: 65   Resp: 17   Temp: 98.3 F (36.8 C)   SpO2: 100%    Filed Weights   12/14/23 1432  Weight: 157 lb (71.2 kg)    Physical Exam No palpable lumps or nodules of bilateral reconstructed breasts or axilla  (exam performed in the presence of a chaperone)  LABORATORY DATA:  I have reviewed the data as listed  Latest Ref Rng & Units 05/23/2021   10:25 AM 11/26/2020   10:52 AM 11/28/2019    8:13 AM  CMP  Glucose 70 - 99 mg/dL 84  94  92   BUN 6 - 23 mg/dL 14  14  12    Creatinine 0.40 - 1.20 mg/dL 4.09  8.11  9.14   Sodium 135 - 145 mEq/L 140  141  142   Potassium 3.5 - 5.1 mEq/L 4.2  3.9  3.6   Chloride 96 - 112 mEq/L 106  108  107    CO2 19 - 32 mEq/L 28  26  26    Calcium 8.4 - 10.5 mg/dL 9.1  8.6  8.9   Total Protein 6.0 - 8.3 g/dL 6.9  6.7  6.8   Total Bilirubin 0.2 - 1.2 mg/dL 0.8  0.9  0.7   Alkaline Phos 39 - 117 U/L 43  56  55   AST 0 - 37 U/L 16  16  15    ALT 0 - 35 U/L 11  9  14      Lab Results  Component Value Date   WBC 6.6 05/23/2021   HGB 12.6 05/23/2021   HCT 37.5 05/23/2021   MCV 94.2 05/23/2021   PLT 234.0 05/23/2021   NEUTROABS 4.7 05/23/2021    ASSESSMENT & PLAN:  Malignant neoplasm of upper-outer quadrant of right breast in female, estrogen receptor positive (HCC) Screening detected right breast mass right breast biopsy 11 o'clock position: Invasive mammary cancer mixed histology, ER 95%, PR 95%, Ki-67 10%, HER-2 -1+ by IHC, Stage Ia Oncotype DX recurrence score: 10, risk of distant recurrence 3%, low risk Breast MRI 08/15/2018: Large area of non-mass enhancement 5.3 x 4.3 x 3.6 cm, medial aspect and another mass 1.8 cm and 2 adjacent satellite lesions total measuring 4 x 3 cm.  Scattered 6 to 7 mm nodules lower portion right breast.  Left breast 0.6 cm enhancing mass (biopsy intermediate grade LCIS)   Treatment plan: 1.  Neoadjuvant antiestrogen therapy with tamoxifen  started 08/19/2018 x 6 months 2.  01/19/2019:Bilateral mastectomies with reconstruction: Right mastectomy: Residual invasive carcinoma, multifocal, 1.6 cm, 0/3 lymph nodes, margins negative, ER 95%, PR 95%, HER-2 -1+, Ki-67 10%, residual cancer burden 1.63, left mastectomy: LCIS 3.  Followed by adjuvant antiestrogen therapy with tamoxifen  x10 years started 02/01/2020/switched to anastrozole once she becomes menopausal. ------------------------------------------------------------------------------------------------------------------------------------------------------------------ Tamoxifen  toxicities: 1.  Mild hot flashes: Patient is tolerating them fairly well.. May 2022: FSH 20.5, estradiol  359  12/14/2022: FSH 25.9, estradiol   100.4 Patient is not in menopause and therefore we will need to continue with the tamoxifen .   Breast cancer surveillance: 1.  Breast exam 12/14/2023: Benign 2.  No role of imaging studies since she had bilateral mastectomies.   She is originally from [place] and plans to go to [place] for a month in the summer closer to [place]. Return to clinic in 1 year for follow-up and we will repeat lab testing at that time.   No orders of the defined types were placed in this encounter.  The patient has a good understanding of the overall plan. she agrees with it. she will call with any problems that may develop before the next visit here. Total time spent: 30 mins including face to face time and time spent for planning, charting and co-ordination of care   Viinay K Mads Borgmeyer, MD 12/14/23

## 2023-12-15 ENCOUNTER — Telehealth: Payer: Self-pay

## 2023-12-15 ENCOUNTER — Inpatient Hospital Stay: Payer: 59 | Admitting: Hematology and Oncology

## 2023-12-15 NOTE — Telephone Encounter (Signed)
 Per md orders entered for [employer]al and all supported documents faxed to [phone]. Faxed confirmation was received.

## 2023-12-16 LAB — FOLLICLE STIMULATING HORMONE: FSH: 44.3 m[IU]/mL

## 2023-12-20 LAB — ESTRADIOL, ULTRA SENS: Estradiol, Sensitive: <2.5 pg/mL

## 2024-01-05 ENCOUNTER — Telehealth: Payer: Self-pay

## 2024-01-05 NOTE — Telephone Encounter (Signed)
 Called pt per MD to advise [employer] testing was negative/not detected.Lvm for pt to return call back if any questions or concerns.

## 2024-01-06 ENCOUNTER — Encounter: Payer: Self-pay | Admitting: Hematology and Oncology

## 2024-05-25 ENCOUNTER — Encounter: Payer: Self-pay | Admitting: Family Medicine

## 2024-05-25 ENCOUNTER — Ambulatory Visit (INDEPENDENT_AMBULATORY_CARE_PROVIDER_SITE_OTHER): Admitting: Family Medicine

## 2024-05-25 VITALS — BP 124/84 | HR 66 | Temp 97.7°F | Ht 66.0 in | Wt 163.0 lb

## 2024-05-25 DIAGNOSIS — Z0001 Encounter for general adult medical examination with abnormal findings: Secondary | ICD-10-CM

## 2024-05-25 DIAGNOSIS — Z853 Personal history of malignant neoplasm of breast: Secondary | ICD-10-CM

## 2024-05-25 DIAGNOSIS — Z0000 Encounter for general adult medical examination without abnormal findings: Secondary | ICD-10-CM | POA: Diagnosis not present

## 2024-05-25 DIAGNOSIS — Z13220 Encounter for screening for lipoid disorders: Secondary | ICD-10-CM | POA: Diagnosis not present

## 2024-05-25 DIAGNOSIS — Z800 Family history of malignant neoplasm of digestive organs: Secondary | ICD-10-CM

## 2024-05-25 LAB — COMPREHENSIVE METABOLIC PANEL WITH GFR
ALT: 14 U/L (ref 0–35)
AST: 16 U/L (ref 0–37)
Albumin: 4.1 g/dL (ref 3.5–5.2)
Alkaline Phosphatase: 52 U/L (ref 39–117)
BUN: 13 mg/dL (ref 6–23)
CO2: 27 meq/L (ref 19–32)
Calcium: 9 mg/dL (ref 8.4–10.5)
Chloride: 107 meq/L (ref 96–112)
Creatinine, Ser: 0.62 mg/dL (ref 0.40–1.20)
GFR: 100.66 mL/min (ref >60.00)
Glucose, Bld: 88 mg/dL (ref 70–99)
Potassium: 3.9 meq/L (ref 3.5–5.1)
Sodium: 142 meq/L (ref 135–145)
Total Bilirubin: 0.5 mg/dL (ref 0.2–1.2)
Total Protein: 6.7 g/dL (ref 6.0–8.3)

## 2024-05-25 LAB — CBC WITH DIFFERENTIAL/PLATELET
Basophils Absolute: 0 K/uL (ref 0.0–0.1)
Basophils Relative: 0.6 % (ref 0.0–3.0)
Eosinophils Absolute: 0 K/uL (ref 0.0–0.7)
Eosinophils Relative: 0.7 % (ref 0.0–5.0)
HCT: 37.2 % (ref 36.0–46.0)
Hemoglobin: 12.5 g/dL (ref 12.0–15.0)
Lymphocytes Relative: 26.4 % (ref 12.0–46.0)
Lymphs Abs: 1.4 K/uL (ref 0.7–4.0)
MCHC: 33.6 g/dL (ref 30.0–36.0)
MCV: 95.1 fl (ref 78.0–100.0)
Monocytes Absolute: 0.4 K/uL (ref 0.1–1.0)
Monocytes Relative: 8.1 % (ref 3.0–12.0)
Neutro Abs: 3.4 K/uL (ref 1.4–7.7)
Neutrophils Relative %: 64.2 % (ref 43.0–77.0)
Platelets: 259 K/uL (ref 150.0–400.0)
RBC: 3.91 Mil/uL (ref 3.87–5.11)
RDW: 13 % (ref 11.5–15.5)
WBC: 5.3 K/uL (ref 4.0–10.5)

## 2024-05-25 LAB — LIPID PANEL
Cholesterol: 160 mg/dL (ref 0–200)
HDL: 59.5 mg/dL (ref >39.00)
LDL Cholesterol: 89 mg/dL (ref 0–99)
NonHDL: 100.15
Total CHOL/HDL Ratio: 3
Triglycerides: 57 mg/dL (ref 0.0–149.0)
VLDL: 11.4 mg/dL (ref 0.0–40.0)

## 2024-05-25 LAB — TSH: TSH: 1.36 u[IU]/mL (ref 0.35–5.50)

## 2024-05-25 LAB — VITAMIN D 25 HYDROXY (VIT D DEFICIENCY, FRACTURES): VITD: 58.02 ng/mL (ref 30.00–100.00)

## 2024-05-25 NOTE — Patient Instructions (Signed)
 Please return in 12 months for your annual complete physical; please come fasting.   I will release your lab results to you on your MyChart account with further instructions. You may see the results before I do, but when I review them I will send you a message with my report or have my assistant call you if things need to be discussed. Please reply to my message with any questions. Thank you!   If you have any questions or concerns, please don't hesitate to send me a message via MyChart or call the office at [phone]. Thank you for visiting with us  today! It's our pleasure caring for you.   Good to see you and glad you are doing well!

## 2024-05-25 NOTE — Progress Notes (Signed)
 Subjective  Chief Complaint  Patient presents with   Annual Exam    Pt here for Annual exam and is not currently fasting. Pap results has been sent for    HPI: Kelli [name] is a 52 y.o. female who presents to [facility] [place] today for a Female Wellness Visit. She also has the concerns and/or needs as listed above in the chief complaint. These will be addressed in addition to the Health Maintenance Visit.  Here to reestablish.  Wellness Visit: annual visit with health maintenance review and exam  HM: s/p bilateral mastectomy; no mammo indicated. Pap current and reviewed 2023 with [facility]. + FH of colon cancer.  January 2023.  Repeat 5 years.  Feeling well.  Son is a Printmaker in college now.  Exercises regularly, weight training and some aerobic activity.  Diet is healthy.  Weight is up a little bit.  Chronic disease f/u and/or acute problem visit: (deemed necessary to be done in addition to the wellness visit): Discussed the use of AI scribe software for clinical note transcription with the patient, who gave verbal consent to proceed.  History of Present Illness Kelli [name] is a 53 year old female who presents for a follow-up regarding menopausal symptoms and medication management.  Vasomotor symptoms, now postmenopausal - Intermittent and sometimes severe hot flashes occurring four to five times daily, on tamoxifen  for breast cancer.  Reviewed recent labs from oncology. FSH 44.9, estradiol  less than 2.5  Nail changes - Thinning and brittle nails  Weight gain - Weight gain despite maintaining an active lifestyle - Engages in regular weight training and group exercise  Social history and family history: Both parents passed in 2023.  Father was 70 years old and passed from COVID complications, mother had passed 3 weeks earlier than him, age 38 from acute renal failure.  Unclear cause.    Assessment  1. Encounter for well adult  exam with abnormal findings   2. History of right breast cancer   3. Family history of colon cancer      Plan  Female Wellness Visit: Age appropriate Health Maintenance and Prevention measures were discussed with patient. Included topics are cancer screening recommendations, ways to keep healthy (see AVS) including dietary and exercise recommendations, regular eye and dental care, use of seat belts, and avoidance of moderate alcohol use and tobacco use.  All screens are current. BMI: discussed patient's BMI and encouraged positive lifestyle modifications to help get to or maintain a target BMI. HM needs and immunizations were addressed and ordered. See below for orders. See HM and immunization section for updates.  Up-to-date Routine labs and screening tests ordered including cmp, cbc and lipids where appropriate. Discussed recommendations regarding Vit D and calcium supplementation (see AVS) She will discuss with oncologist recent lab findings consistent with menopause and see if she can change to Arimidex from tamoxifen  to help alleviate some hot flashes.  Chronic disease management visit and/or acute problem visit: Assessment and Plan Assessment & Plan     Follow up: 12 mo for cpe  Orders Placed This Encounter  Procedures   VITAMIN D 25 Hydroxy (Vit-D Deficiency, Fractures)   CBC with Differential/Platelet   Comprehensive metabolic panel with GFR   Lipid panel   TSH   HM PAP SMEAR   No orders of the defined types were placed in this encounter.     Body mass index is 26.31 kg/m. Wt Readings from Last 3  Encounters:  05/25/24 163 lb (73.9 kg)  12/14/23 157 lb (71.2 kg)  12/14/22 152 lb 12.8 oz (69.3 kg)     Patient Active Problem List   Diagnosis Date Noted   History of right breast cancer 01/19/2019    S/p bilateral mastectomy and with implants 2020 Had precancerous lesions in left breast as well. Failed conservative mgt    Malignant neoplasm of upper-outer quadrant  of right breast in female, estrogen receptor positive (HCC) 08/18/2018   Family history of colon cancer    Family history of prostate cancer    Cotton wool spots 05/17/2018   Health Maintenance  Topic Date Due   Hepatitis B Vaccines 19-59 Average Risk (1 of 3 - 19+ 3-dose series) Never done   Pneumococcal Vaccine: 50+ Years (1 of 1 - PCV) Never done   COVID-19 Vaccine (1) 06/10/2024 (Originally 07/23/1976)   Colonoscopy  08/12/2026   Cervical Cancer Screening (HPV/Pap Cotest)  10/28/2026   DTaP/Tdap/Td (2 - Td or Tdap) 05/27/2029   Influenza Vaccine  Completed   Hepatitis C Screening  Completed   HIV Screening  Completed   Zoster Vaccines- Shingrix  Completed   HPV VACCINES  Aged Out   Meningococcal B Vaccine  Aged Out   Mammogram  Discontinued   Immunization History  Administered Date(s) Administered   Influenza,inj,Quad PF,6+ Mos 05/23/2021, 05/18/2024   Influenza-Unspecified 05/24/2018, 05/05/2019   Tdap 05/28/2019   Zoster Recombinant(Shingrix) 05/23/2021, 10/22/2021   We updated and reviewed the patient's past history in detail and it is documented below. Allergies: Patient is allergic to ciprofloxacin , doxycycline , and sulfa antibiotics. Past Medical History Patient  has a past medical history of Allergy (1999), Breast cancer [facility]) (July 25, 2018), Family history of colon cancer, Family history of prostate cancer, Malignant neoplasm of upper-outer quadrant of right breast in female, estrogen receptor positive (HCC) (08/18/2018), and Migraines. Past Surgical History Patient  has a past surgical history that includes Breast biopsy (2004); Nipple sparing mastectomy with sentinel lymph node biopsy (Bilateral, 01/19/2019); Breast Reconstruction with placement of Tissue Expander and Alloderm (Bilateral, 01/19/2019); Removal of bilateral tissue expanders with placement of bilateral breast implants (Bilateral, 04/21/2019); Liposuction with lipofilling (Bilateral, 04/21/2019);  Wisdom tooth extraction; Dental surgery; Colonoscopy; and Cosmetic surgery (2020). Family History: Patient family history includes Arthritis in her maternal grandmother and mother; Cancer - Colon (age of onset: 20) in her mother; Colon cancer (age of onset: 41) in her mother; Colon polyps in her mother; Diabetes in her paternal grandmother; Hearing loss in her father; Hypertension in her maternal grandfather and mother; Learning disabilities in her son; Prostate cancer in her maternal uncle. Social History:  Patient  reports that she has never smoked. She has never used smokeless tobacco. She reports current alcohol use of about 5.0 standard drinks of alcohol per week. She reports that she does not use drugs.  Review of Systems: Constitutional: negative for fever or malaise Ophthalmic: negative for photophobia, double vision or loss of vision Cardiovascular: negative for chest pain, dyspnea on exertion, or new LE swelling Respiratory: negative for SOB or persistent cough Gastrointestinal: negative for abdominal pain, change in bowel habits or melena Genitourinary: negative for dysuria or gross hematuria, no abnormal uterine bleeding or disharge Musculoskeletal: negative for new gait disturbance or muscular weakness Integumentary: negative for new or persistent rashes, no breast lumps Neurological: negative for TIA or stroke symptoms Psychiatric: negative for SI or delusions Allergic/Immunologic: negative for hives  Patient Care Team    Relationship Specialty Notifications  Start End  Jodie Lavern CROME, MD PCP - General Family Medicine  05/25/24   Izell Domino, MD Attending Physician Radiation Oncology  04/21/19   Arelia Filippo, MD Consulting Physician Plastic Surgery  04/21/19   Odean Potts, MD Consulting Physician Hematology and Oncology  04/21/19   Vanderbilt Ned, MD Consulting Physician General Surgery  04/21/19   Latisha Medford, MD Consulting Physician Obstetrics and Gynecology   09/08/19     Objective  Vitals: BP 124/84   Pulse 66   Temp 97.7 F (36.5 C)   Ht 5' 6 (1.676 m)   Wt 163 lb (73.9 kg)   SpO2 99%   BMI 26.31 kg/m  General:  Well developed, well nourished, no acute distress, appears well Psych:  Alert and orientedx3,normal mood and affect HEENT:  Normocephalic, atraumatic, non-icteric sclera,  supple neck without adenopathy, mass or thyromegaly Cardiovascular:  Normal S1, S2, RRR without gallop, rub or murmur Respiratory:  Good breath sounds bilaterally, CTAB with normal respiratory effort Gastrointestinal: normal bowel sounds, soft, non-tender, no noted masses. No HSM MSK: extremities without edema, joints without erythema or swelling Neurologic:    Mental status is normal.  Gross motor and sensory exams are normal.  No tremor  Commons side effects, risks, benefits, and alternatives for medications and treatment plan prescribed today were discussed, and the patient expressed understanding of the given instructions. Patient is instructed to call or message via MyChart if he/she has any questions or concerns regarding our treatment plan. No barriers to understanding were identified. We discussed Red Flag symptoms and signs in detail. Patient expressed understanding regarding what to do in case of urgent or emergency type symptoms.  Medication list was reconciled, printed and provided to the patient in AVS. Patient instructions and summary information was reviewed with the patient as documented in the AVS. This note was prepared with assistance of Dragon voice recognition software. Occasional wrong-word or sound-a-like substitutions may have occurred due to the inherent limitations of voice recognition software

## 2024-05-27 ENCOUNTER — Ambulatory Visit: Payer: Self-pay | Admitting: Family Medicine

## 2024-05-27 NOTE — Progress Notes (Signed)
 See mychart note The 10-year ASCVD risk score (Arnett DK, et al., 2019) is: 1.2%   Values used to calculate the score:     Age: 53 years     Clincally relevant sex: Female     Is Non-Hispanic African American: No     Diabetic: No     Tobacco smoker: No     Systolic Blood Pressure: 124 mmHg     Is BP treated: No     HDL Cholesterol: 59.5 mg/dL     Total Cholesterol: 160 mg/dL

## 2024-05-31 ENCOUNTER — Encounter: Payer: Self-pay | Admitting: *Deleted

## 2024-05-31 NOTE — Progress Notes (Signed)
 Guardant Reveal renewal orders placed via the portal.

## 2024-06-16 ENCOUNTER — Telehealth: Payer: Self-pay | Admitting: *Deleted

## 2024-06-16 DIAGNOSIS — Z170 Estrogen receptor positive status [ER+]: Secondary | ICD-10-CM

## 2024-06-16 NOTE — Telephone Encounter (Signed)
 Received call from pt stating her PCP reviewed menopause labs from May of 2025 and indicated she is in menopause and needs to reach out to our office regarding Tamoxifen  she is currently on.  Pt MD, repeat menopause labs and f/u 1 weeks later to discuss.  Orders placed, message sent to scheduling, pt educated and verbalized understanding.

## 2024-06-16 NOTE — Telephone Encounter (Signed)
 Received VM from pt stating recent labs with PCP show she is in menopause and needs to discuss changing Tamoxifen  tx.  RN attempt x1 to return call.  No answer, LVM for pt to return call to the office.

## 2024-06-21 ENCOUNTER — Inpatient Hospital Stay: Attending: Hematology and Oncology

## 2024-06-21 DIAGNOSIS — Z170 Estrogen receptor positive status [ER+]: Secondary | ICD-10-CM | POA: Diagnosis not present

## 2024-06-21 DIAGNOSIS — Z79811 Long term (current) use of aromatase inhibitors: Secondary | ICD-10-CM | POA: Diagnosis not present

## 2024-06-21 DIAGNOSIS — C50411 Malignant neoplasm of upper-outer quadrant of right female breast: Secondary | ICD-10-CM | POA: Diagnosis present

## 2024-06-22 LAB — FOLLICLE STIMULATING HORMONE: FSH: 40.9 m[IU]/mL

## 2024-06-24 LAB — ESTRADIOL, ULTRA SENS: Estradiol, Sensitive: 2.7 pg/mL

## 2024-07-03 ENCOUNTER — Inpatient Hospital Stay: Attending: Hematology and Oncology | Admitting: Hematology and Oncology

## 2024-07-03 VITALS — BP 111/47 | HR 82 | Temp 97.7°F | Resp 18 | Ht 66.0 in | Wt 162.4 lb

## 2024-07-03 DIAGNOSIS — Z9013 Acquired absence of bilateral breasts and nipples: Secondary | ICD-10-CM | POA: Insufficient documentation

## 2024-07-03 DIAGNOSIS — Z79811 Long term (current) use of aromatase inhibitors: Secondary | ICD-10-CM | POA: Insufficient documentation

## 2024-07-03 DIAGNOSIS — Z1721 Progesterone receptor positive status: Secondary | ICD-10-CM | POA: Diagnosis not present

## 2024-07-03 DIAGNOSIS — Z780 Asymptomatic menopausal state: Secondary | ICD-10-CM | POA: Diagnosis not present

## 2024-07-03 DIAGNOSIS — Z17411 Hormone receptor positive with human epidermal growth factor receptor 2 negative status: Secondary | ICD-10-CM | POA: Insufficient documentation

## 2024-07-03 DIAGNOSIS — C50411 Malignant neoplasm of upper-outer quadrant of right female breast: Secondary | ICD-10-CM | POA: Diagnosis not present

## 2024-07-03 DIAGNOSIS — Z170 Estrogen receptor positive status [ER+]: Secondary | ICD-10-CM | POA: Diagnosis not present

## 2024-07-03 MED ORDER — ANASTROZOLE 1 MG PO TABS: 1.0000 mg | ORAL_TABLET | Freq: Every day | ORAL | 3 refills | Status: AC

## 2024-07-03 NOTE — Assessment & Plan Note (Signed)
 Screening detected right breast mass right breast biopsy 11 o'clock position: Invasive mammary cancer mixed histology, ER 95%, PR 95%, Ki-67 10%, HER-2 -1+ by IHC, Stage Ia Oncotype DX recurrence score: 10, risk of distant recurrence 3%, low risk Breast MRI 08/15/2018: Large area of non-mass enhancement 5.3 x 4.3 x 3.6 cm, medial aspect and another mass 1.8 cm and 2 adjacent satellite lesions total measuring 4 x 3 cm.  Scattered 6 to 7 mm nodules lower portion right breast.  Left breast 0.6 cm enhancing mass (biopsy intermediate grade LCIS)   Treatment plan: 1.  Neoadjuvant antiestrogen therapy with tamoxifen  started 08/19/2018 x 6 months 2.  01/19/2019:Bilateral mastectomies with reconstruction: Right mastectomy: Residual invasive carcinoma, multifocal, 1.6 cm, 0/3 lymph nodes, margins negative, ER 95%, PR 95%, HER-2 -1+, Ki-67 10%, residual cancer burden 1.63, left mastectomy: LCIS 3.  Followed by adjuvant antiestrogen therapy with tamoxifen  x10 years started 02/01/2020/switched to anastrozole  once she becomes menopausal. ------------------------------------------------------------------------------------------------------------------------------------------------------------------ Tamoxifen  toxicities: 1.  Mild hot flashes: Patient is tolerating them fairly well.. May 2022: FSH 20.5, estradiol  359  12/14/2022: FSH 25.9, estradiol  100.4 06/21/2024: FSH 40.9, estradiol  2.7 (patient is in menopause)  Recommendation: Consider switching her from tamoxifen  to anastrozole  therapy.  We the pros and cons of both of these treatments.  Breast cancer surveillance: 1.  Breast exam 12/14/2023: Benign 2.  No role of imaging studies since she had bilateral mastectomies.   She is originally from [place] and [place] to go to [place] month in the summer closer to [place]. Return to clinic in 1 year for follow-up

## 2024-07-03 NOTE — Progress Notes (Signed)
 Patient Care Team: Jodie Lavern CROME, MD as PCP - General (Family Medicine) Izell Domino, MD as Attending Physician (Radiation Oncology) Arelia Filippo, MD as Consulting Physician (Plastic Surgery) Odean Potts, MD as Consulting Physician (Hematology and Oncology) Vanderbilt Ned, MD as Consulting Physician (General Surgery) Latisha Medford, MD as Consulting Physician (Obstetrics and Gynecology)  DIAGNOSIS:  Encounter Diagnoses  Name Primary?   Malignant neoplasm of upper-outer quadrant of right breast in female, estrogen receptor positive (HCC) Yes   Post-menopausal     SUMMARY OF ONCOLOGIC HISTORY: Oncology History  Malignant neoplasm of upper-outer quadrant of right breast in female, estrogen receptor positive (HCC)  07/22/2018 Initial Diagnosis   Screening detected right breast mass right breast biopsy 11 o'clock position: Invasive mammary cancer mixed histology, ER 95%, PR 95%, Ki-67 10%, HER-2 -1+ by IHC,   08/14/2018 Breast MRI   Right breast upper central portion non-mass enhancement 5.3 cm, along the medial aspect discrete mass 1.8 cm, 2 adjacent satellite nodules together make 4 cm.  Scattered 6 to 7 mm enhancing nodules lower portion right breast, left breast 0.6 cm enhancing mass    08/19/2018 Cancer Staging   Staging form: Breast, AJCC 8th Edition - Clinical: Stage IA (cT1c, cN0, cM0, G2, ER+, PR+, HER2-) - Signed by Izell Domino, MD on 08/19/2018   08/2018 -  Anti-estrogen oral therapy   Tamoxifen  daily; plan for 10 years of therapy   01/19/2019 Surgery   Bilateral mastectomies with reconstruction ([facility] [place]): Right mastectomy: Residual invasive carcinoma, multifocal, 1.6 cm, 0/3 lymph nodes, margins negative, ER 95%, PR 95%, HER-2 -1+, Ki-67 10%, residual cancer burden 1.63, left mastectomy: LCIS   01/19/2019 Cancer Staging   Staging form: Breast, AJCC 8th Edition - Pathologic stage from 01/19/2019: No Stage Recommended (ypT1c, pN0, cM0, G2, ER+, PR+,  HER2-) - Signed by Crawford Morna Pickle, NP on 02/08/2019     CHIEF COMPLIANT: Follow-up to discuss her treatment plan  HISTORY OF PRESENT ILLNESS:  History of Present Illness Kelli [name] is a 53 year old female with breast cancer who presents for follow-up regarding tamoxifen  therapy and menopause management. She was referred by her primary care physician after blood work indicated menopause.  She has taken tamoxifen  since February 2020. Menopausal labs in May led to discussion of switching to anastrozole.  She has worsening hot flashes over the last 18 months, mainly at night. Taking tamoxifen  earlier in the day lessens nighttime symptoms.  She has not had a bone density scan. She takes vitamin D3, maintains dietary calcium, and does weight-bearing exercise five times a week.     ALLERGIES:  is allergic to ciprofloxacin , doxycycline , and sulfa antibiotics.  MEDICATIONS:  Current Outpatient Medications  Medication Sig Dispense Refill   anastrozole (ARIMIDEX) 1 MG tablet Take 1 tablet (1 mg total) by mouth daily. 90 tablet 3   Cholecalciferol (VITAMIN D3) 125 MCG (5000 UT) CAPS Take by mouth.     Multiple Vitamins-Minerals (EYE VITAMINS PO) Take by mouth. Ocuvite     triamcinolone  cream (KENALOG ) 0.1 % Apply 1 application topically 2 (two) times daily. For 1 week, then as needed 45 g 0   No current facility-administered medications for this visit.    PHYSICAL EXAMINATION: ECOG PERFORMANCE STATUS: 1 - Symptomatic but completely ambulatory  Vitals:   07/03/24 1325  BP: (!) 111/47  Pulse: 82  Resp: 18  Temp: 97.7 F (36.5 C)  SpO2: 99%   Filed Weights   07/03/24 1325  Weight: 162  lb 6.4 oz (73.7 kg)    LABORATORY DATA:  I have reviewed the data as listed    Latest Ref Rng & Units 05/25/2024   10:18 AM 05/23/2021   10:25 AM 11/26/2020   10:52 AM  CMP  Glucose 70 - 99 mg/dL 88  84  94   BUN 6 - 23 mg/dL 13  14  14    Creatinine 0.40 - 1.20 mg/dL 9.37   9.32  9.33   Sodium 135 - 145 mEq/L 142  140  141   Potassium 3.5 - 5.1 mEq/L 3.9  4.2  3.9   Chloride 96 - 112 mEq/L 107  106  108   CO2 19 - 32 mEq/L 27  28  26    Calcium 8.4 - 10.5 mg/dL 9.0  9.1  8.6   Total Protein 6.0 - 8.3 g/dL 6.7  6.9  6.7   Total Bilirubin 0.2 - 1.2 mg/dL 0.5  0.8  0.9   Alkaline Phos 39 - 117 U/L 52  43  56   AST 0 - 37 U/L 16  16  16    ALT 0 - 35 U/L 14  11  9      Lab Results  Component Value Date   WBC 5.3 05/25/2024   HGB 12.5 05/25/2024   HCT 37.2 05/25/2024   MCV 95.1 05/25/2024   PLT 259.0 05/25/2024   NEUTROABS 3.4 05/25/2024    ASSESSMENT & PLAN:  Malignant neoplasm of upper-outer quadrant of right breast in female, estrogen receptor positive (HCC) Screening detected right breast mass right breast biopsy 11 o'clock position: Invasive mammary cancer mixed histology, ER 95%, PR 95%, Ki-67 10%, HER-2 -1+ by IHC, Stage Ia Oncotype DX recurrence score: 10, [name] of distant recurrence 3%, low [name] Breast MRI 08/15/2018: Large area of non-mass enhancement 5.3 x 4.3 x 3.6 cm, medial aspect and another mass 1.8 cm and 2 adjacent satellite lesions total measuring 4 x 3 cm.  Scattered 6 to 7 mm nodules lower portion right breast.  Left breast 0.6 cm enhancing mass (biopsy intermediate grade LCIS)   Treatment plan: 1.  Neoadjuvant antiestrogen therapy with tamoxifen  started 08/19/2018 x 6 months 2.  01/19/2019:Bilateral mastectomies with reconstruction: Right mastectomy: Residual invasive carcinoma, multifocal, 1.6 cm, 0/3 lymph nodes, margins negative, ER 95%, PR 95%, HER-2 -1+, Ki-67 10%, residual cancer burden 1.63, left mastectomy: LCIS 3.  Followed by adjuvant antiestrogen therapy with tamoxifen  x10 years started 02/01/2020/switched to anastrozole once she becomes menopausal. ------------------------------------------------------------------------------------------------------------------------------------------------------------------ Tamoxifen   toxicities: 1.  Mild hot flashes: Patient is tolerating them fairly well.. May 2022: FSH 20.5, estradiol  359  12/14/2022: FSH 25.9, estradiol  100.4 06/21/2024: FSH 40.9, estradiol  2.7 (patient is in menopause)  Recommendation: Since she is menopausal I recommend switching her from tamoxifen  to anastrozole.  She has been on tamoxifen  for about 6 years.  She will take anastrozole for another 4 years.  She will start 07/17/2024. We discussed the difference in mechanism of action and differences in side effects between the 2 drugs.  Breast cancer surveillance: 1.  Breast exam 12/14/2023: Benign 2.  No role of imaging studies since she had bilateral mastectomies.   She is originally from [place] and plans to go to [place] for a month in the summer closer to [place].  Assessment & Plan Estrogen receptor positive malignant neoplasm of upper-outer quadrant of right breast Considering switch to anastrozole due to worsening hot flashes and post-menopausal status. Anastrozole is superior in post-menopausal women as per ATAC trial. Discussed side  effects: anastrozole may cause joint stiffness and reduce bone density, while tamoxifen  can cause hot flashes, muscle cramps, blood clots, and uterine hypertrophy. She is interested in switching to anastrozole to potentially reduce hot flashes and is willing to monitor for joint stiffness and bone density changes. - Initiated anastrozole on December 15th, 2024. - Ordered baseline DEXA scan to monitor bone density. - Encouraged calcium intake and weight-bearing exercises. - Scheduled follow-up phone call in three months to assess response to anastrozole.      Orders Placed This Encounter  Procedures   DG Bone Density    Standing Status:   Future    Expected Date:   10/01/2024    Expiration Date:   07/03/2025    Reason for Exam (SYMPTOM  OR DIAGNOSIS REQUIRED):   Post menopausal    Is patient pregnant?:   No    Preferred imaging location?:   [facility]   The patient has a good understanding of the overall plan. she agrees with it. she will call with any problems that may develop before the next visit here.  I personally spent a total of 30 minutes in the care of the patient today including preparing to see the patient, getting/reviewing separately obtained history, performing a medically appropriate exam/evaluation, counseling and educating, placing orders, referring and communicating with other health care professionals, documenting clinical information in the EHR, independently interpreting results, communicating results, and coordinating care.   Viinay K Toney Difatta, MD 07/03/24

## 2024-07-12 ENCOUNTER — Encounter: Payer: Self-pay | Admitting: *Deleted

## 2024-07-12 NOTE — Progress Notes (Signed)
 Per MD request RN placed call to pt with recent Guardant Reveal results being negative.  Pt educated and verbalized understanding.

## 2024-07-14 ENCOUNTER — Encounter: Payer: Self-pay | Admitting: Hematology and Oncology

## 2024-09-07 ENCOUNTER — Telehealth: Payer: Self-pay | Admitting: Hematology and Oncology

## 2024-09-07 NOTE — Telephone Encounter (Signed)
 I spoke with patient and she is aware of reschedule telephone appointment with MD from 10/02/2024 to 10/09/2024.

## 2024-10-02 ENCOUNTER — Inpatient Hospital Stay: Admitting: Hematology and Oncology

## 2024-10-09 ENCOUNTER — Inpatient Hospital Stay: Attending: Hematology and Oncology | Admitting: Hematology and Oncology

## 2024-12-13 ENCOUNTER — Ambulatory Visit: Admitting: Hematology and Oncology

## 2024-12-13 ENCOUNTER — Other Ambulatory Visit
